# Patient Record
Sex: Female | Born: 1970 | Race: White | Hispanic: Yes | Smoking: Never smoker
Health system: Southern US, Community
[De-identification: ages and names within clinical notes are randomized; demographics above are authoritative.]

## PROBLEM LIST (undated history)

## (undated) DIAGNOSIS — C50919 Malignant neoplasm of unspecified site of unspecified female breast: Secondary | ICD-10-CM

## (undated) HISTORY — PX: CHOLECYSTECTOMY: SHX55

## (undated) HISTORY — DX: Malignant neoplasm of unspecified site of unspecified female breast: C50.919

---

## 2000-12-24 ENCOUNTER — Encounter: Payer: Self-pay | Admitting: Family Medicine

## 2000-12-24 ENCOUNTER — Ambulatory Visit (HOSPITAL_COMMUNITY): Admission: RE | Admit: 2000-12-24 | Discharge: 2000-12-24 | Payer: Self-pay | Admitting: Family Medicine

## 2001-01-15 ENCOUNTER — Observation Stay (HOSPITAL_COMMUNITY): Admission: RE | Admit: 2001-01-15 | Discharge: 2001-01-16 | Payer: Self-pay | Admitting: Surgery

## 2001-01-15 ENCOUNTER — Encounter (INDEPENDENT_AMBULATORY_CARE_PROVIDER_SITE_OTHER): Payer: Self-pay

## 2001-10-10 ENCOUNTER — Ambulatory Visit (HOSPITAL_COMMUNITY): Admission: RE | Admit: 2001-10-10 | Discharge: 2001-10-10 | Payer: Self-pay | Admitting: Family Medicine

## 2001-10-10 ENCOUNTER — Encounter: Payer: Self-pay | Admitting: Family Medicine

## 2001-11-04 ENCOUNTER — Encounter: Payer: Self-pay | Admitting: Surgery

## 2001-11-04 ENCOUNTER — Ambulatory Visit (HOSPITAL_COMMUNITY): Admission: RE | Admit: 2001-11-04 | Discharge: 2001-11-04 | Payer: Self-pay | Admitting: Surgery

## 2005-08-14 ENCOUNTER — Ambulatory Visit: Payer: Self-pay | Admitting: Nurse Practitioner

## 2005-08-23 ENCOUNTER — Ambulatory Visit: Payer: Self-pay | Admitting: Nurse Practitioner

## 2005-08-24 ENCOUNTER — Ambulatory Visit: Payer: Self-pay | Admitting: *Deleted

## 2005-09-18 ENCOUNTER — Encounter: Admission: RE | Admit: 2005-09-18 | Discharge: 2005-09-18 | Payer: Self-pay | Admitting: Internal Medicine

## 2005-11-10 ENCOUNTER — Ambulatory Visit: Payer: Self-pay | Admitting: Nurse Practitioner

## 2005-12-27 ENCOUNTER — Ambulatory Visit: Payer: Self-pay | Admitting: Nurse Practitioner

## 2006-03-22 ENCOUNTER — Ambulatory Visit: Payer: Self-pay | Admitting: Nurse Practitioner

## 2006-10-31 ENCOUNTER — Inpatient Hospital Stay (HOSPITAL_COMMUNITY): Admission: RE | Admit: 2006-10-31 | Discharge: 2006-11-02 | Payer: Self-pay | Admitting: Obstetrics

## 2007-08-07 ENCOUNTER — Encounter (INDEPENDENT_AMBULATORY_CARE_PROVIDER_SITE_OTHER): Payer: Self-pay | Admitting: *Deleted

## 2010-08-14 ENCOUNTER — Inpatient Hospital Stay (HOSPITAL_COMMUNITY): Admission: AD | Admit: 2010-08-14 | Discharge: 2010-08-16 | Payer: Self-pay | Admitting: Obstetrics

## 2010-12-11 ENCOUNTER — Encounter: Payer: Self-pay | Admitting: Internal Medicine

## 2011-02-02 LAB — CBC
HCT: 29.8 % — ABNORMAL LOW (ref 36.0–46.0)
HCT: 36.3 % (ref 36.0–46.0)
Hemoglobin: 12.4 g/dL (ref 12.0–15.0)
MCH: 30.2 pg (ref 26.0–34.0)
MCHC: 34.3 g/dL (ref 30.0–36.0)
MCHC: 34.3 g/dL (ref 30.0–36.0)
MCV: 88 fL (ref 78.0–100.0)
MCV: 89.6 fL (ref 78.0–100.0)
Platelets: 318 10*3/uL (ref 150–400)
RBC: 4.13 MIL/uL (ref 3.87–5.11)
RDW: 15.3 % (ref 11.5–15.5)
RDW: 15.7 % — ABNORMAL HIGH (ref 11.5–15.5)
WBC: 5 10*3/uL (ref 4.0–10.5)

## 2011-02-02 LAB — RPR: RPR Ser Ql: NONREACTIVE

## 2011-04-07 NOTE — Op Note (Signed)
[facility]  Patient:    KALENA, MANDER                         MRN: [id number] Proc. Date: 01/15/01 Adm. Date:  [id number] Attending:  Abigail Miyamoto A                           Operative Report  PREOPERATIVE DIAGNOSIS:  Symptomatic cholelithiasis.  POSTOPERATIVE DIAGNOSIS:  Symptomatic cholelithiasis.  OPERATIVE PROCEDURE:  Laparoscopic cholecystectomy.  SURGEON:  Abigail Miyamoto, M.D.  ASSISTANT:  Gita Kudo, M.D.  ANESTHESIA:  General endotracheal; 0.25% Marcaine plain.  ESTIMATED BLOOD LOSS:  Minimal.  DESCRIPTION OF PROCEDURE:  The patient was brought to the operating room, identified as [employer]. She was placed supine position on the operating room table and general endotracheal anesthesia was induced. Her  abdomen was the prepped and draped in the usual sterile fashion.  Using a #15 blade a small transverse incision was made below the umbilicus. The incision was carried down to the fascia which was then opened with the scalpel. A hemostat was then used to pass into the peritoneal cavity. Next, a #0 Vicryl pursestring suture was placed around the fascial opening. The Hasson port was then placed through the opening and insufflation of the abdomen was begun. Next, an 11 mm port was placed in the patients epigastrium and two 5 mm ports were placed in the patients right flank, all under direct vision. The gallbladder was then identified and grasped and retracted above the liver bed. Dissection was then carried out at the base of the gallbladder. The cystic duct was dissected out and clipped three times proximally and once distally and then transected with scissors. The cystic artery and a branch were then dissected out and clipped several times proximally and once distally and transected as well. The gallbladder was then dissected free from the liver bed with electrocautery. Hemostasis appeared to be achieved in the liver bed with  the cautery. Once the gallbladder was detached from the liver, it was pulled out through the at the umbilicus. The #0 Vicryl at the umbilicus was then tied in place. The abdomen was then copiously irrigated with normal saline; again, hemostasis appeared to be achieved. Next, all ports were then removed under direct vision and the abdomen was deflated. All incisions were then anesthetized with 0.25% Marcaine and closed with 4-0 subcuticular Monocryl sutures. Steri-Strips, gauze and Tegaderm were applied.  The patient tolerated the procedure well. All sponge, needle, lap and instrument counts were correct at the end of the procedure. The patient was then extubated in the operating room and taken in stable condition to the recovery room. DD:  01/15/01 TD:  01/16/01 Job: [id number]

## 2020-07-27 ENCOUNTER — Other Ambulatory Visit: Payer: Self-pay | Admitting: Nurse Practitioner

## 2020-07-27 ENCOUNTER — Other Ambulatory Visit: Payer: Self-pay

## 2020-07-27 ENCOUNTER — Ambulatory Visit
Admission: RE | Admit: 2020-07-27 | Discharge: 2020-07-27 | Disposition: A | Discharge status: Home / Self Care | Payer: No Typology Code available for payment source | Source: Ambulatory Visit | Attending: Nurse Practitioner | Admitting: Nurse Practitioner

## 2020-07-27 DIAGNOSIS — M25511 Pain in right shoulder: Secondary | ICD-10-CM

## 2020-07-27 DIAGNOSIS — M542 Cervicalgia: Secondary | ICD-10-CM

## 2021-11-17 IMAGING — CR DG CERVICAL SPINE COMPLETE 4+V
5 series · 5 of 5 positions shown · non-contrast
Comparison: None.

CLINICAL DATA: Neck/shoulder pain

EXAM:
CERVICAL SPINE - COMPLETE 4+ VIEW

[w cervical spine lat]
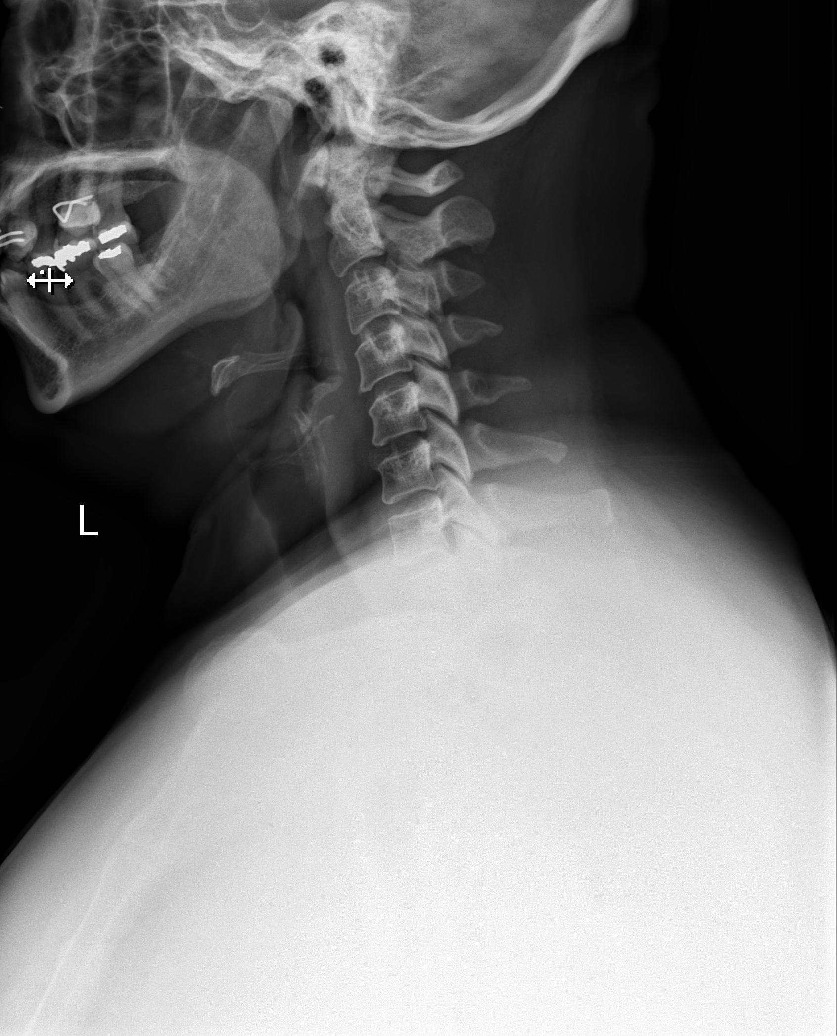

[w cervical spine ap_obl (1 of 2)]
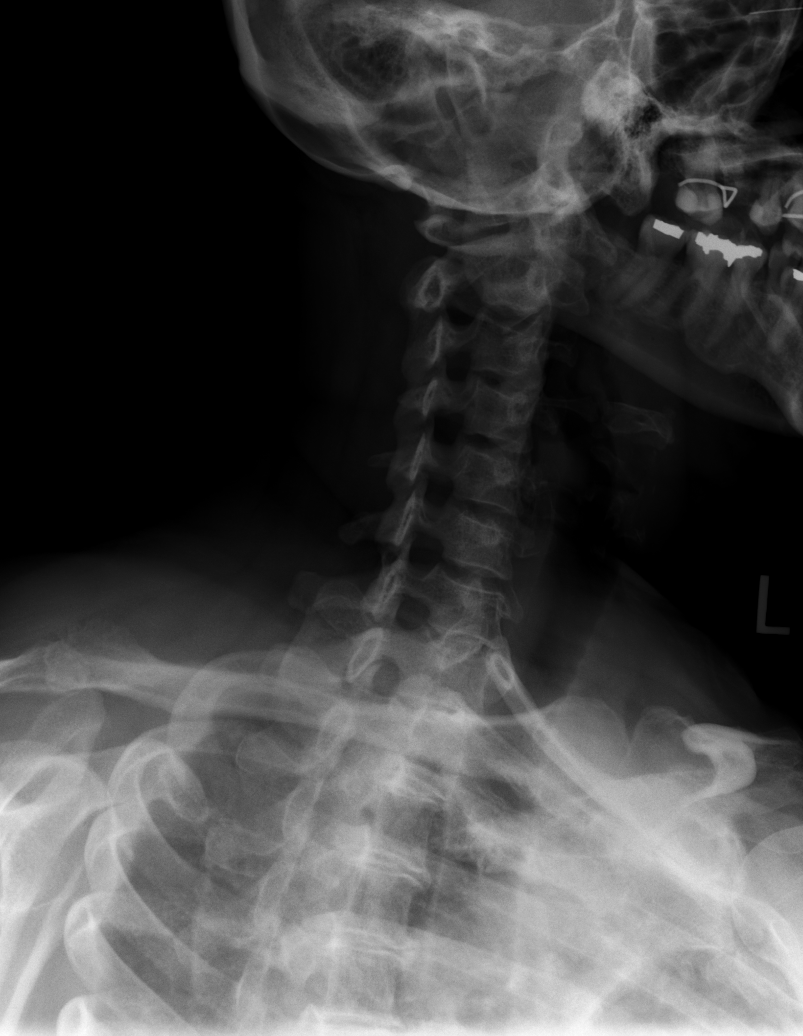

[w cervical spine ap_obl (2 of 2)]
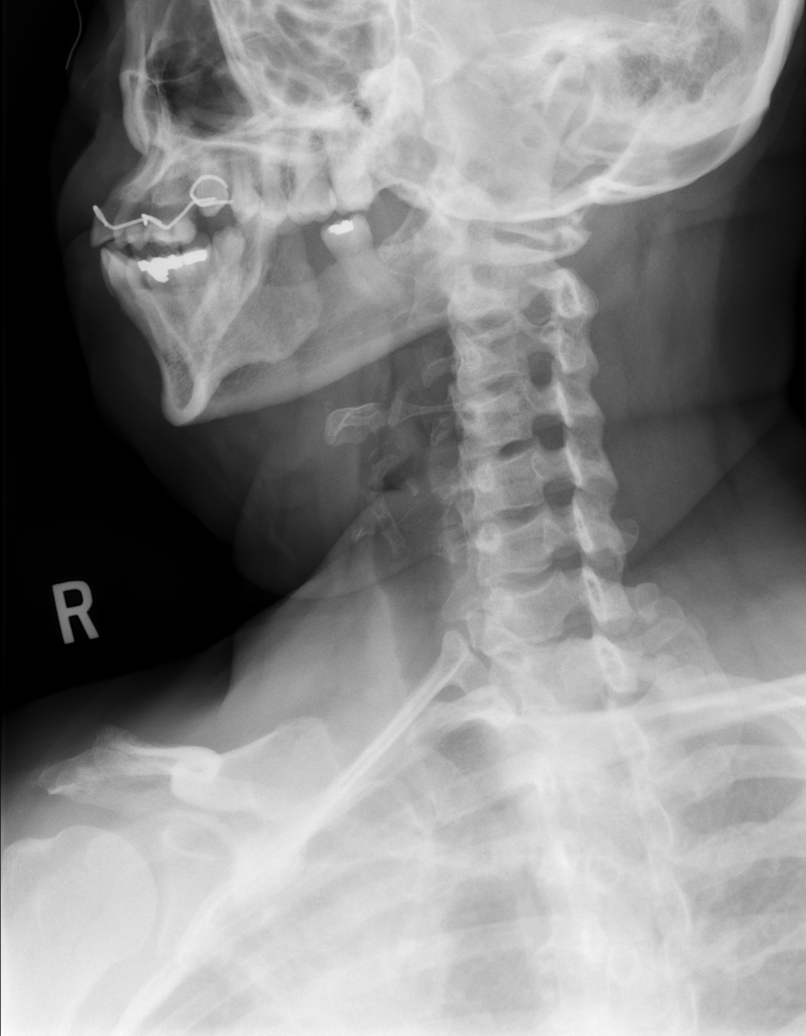

[w cervical spine ap]
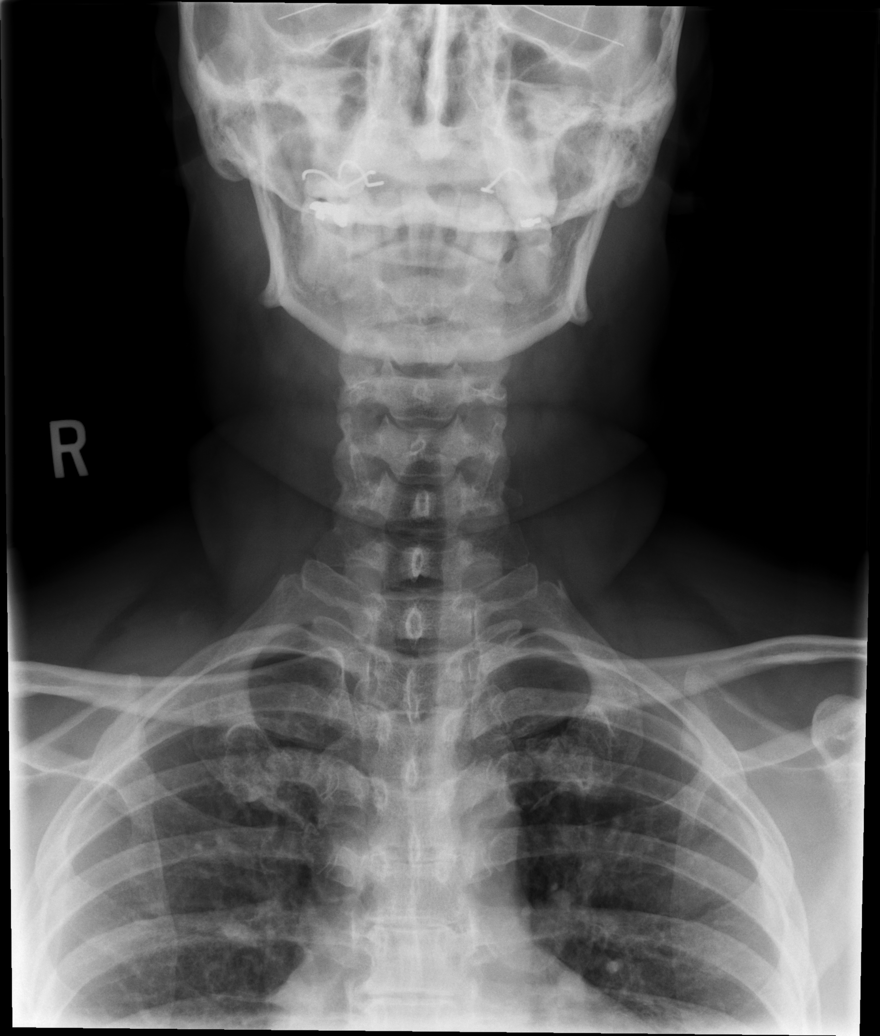

[w cervical spine odontoid]
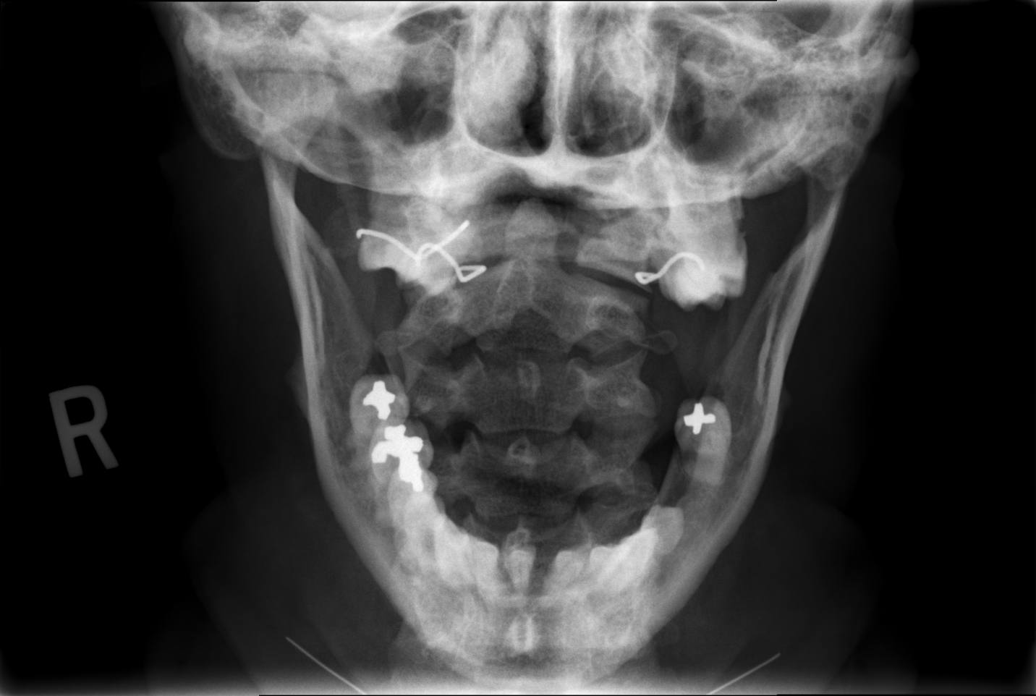

[5 of 5 positions shown; findings below may reference images not displayed]

FINDINGS: Straightening of the cervical spine, likely positional.

No evidence of fracture or dislocation. Vertebral body heights and
intervertebral disc spaces are maintained. Dens appears intact.
Lateral masses of C1 are symmetric.

Bilateral neural foramina are patent.

Visualized lung apices are clear.
IMPRESSION: Negative cervical spine radiographs.

## 2023-01-15 ENCOUNTER — Ambulatory Visit
Admission: EM | Admit: 2023-01-15 | Discharge: 2023-01-15 | Disposition: A | Discharge status: Home / Self Care | Payer: No Typology Code available for payment source

## 2023-01-15 DIAGNOSIS — R220 Localized swelling, mass and lump, head: Secondary | ICD-10-CM

## 2023-01-15 DIAGNOSIS — L509 Urticaria, unspecified: Secondary | ICD-10-CM

## 2023-01-15 MED ORDER — PREDNISONE 20 MG PO TABS: ORAL_TABLET | ORAL | 0 refills | Status: DC

## 2023-01-15 NOTE — ED Provider Notes (Addendum)
[facility]  Note:  This document was prepared using Systems analyst and may include unintentional dictation errors.  MRN: [id number] DOB: 01-01-1971  Subjective:   Latoya Fernandez is a 52 y.o. female presenting for 2 month history of persistent intermittent eyelid swelling, facial swelling, hives, itching.  Patient was seen at a different clinic and advised to start montelukast and Benadryl which she has been doing consistently.  Patient does not have the symptoms every day but feels like she is not getting resolution.  The only possible inciting factor she can think of is a bunny rabbit that they have had for the past 2 years. Denies eating any new foods, starting new medications, exposure to poisonous plants, new hygiene products, new cleaning products or detergents.  No oral swelling, chest tightness, shortness of breath, wheezing.  No current facility-administered medications for this encounter.  Current Outpatient Medications:    montelukast (SINGULAIR) 10 MG tablet, Take 10 mg by mouth at bedtime., Disp: , Rfl:    No Known Allergies  History reviewed. No pertinent past medical history.   Past Surgical History:  Procedure Laterality Date   CHOLECYSTECTOMY      No family history on file.  Social History   Tobacco Use   Smoking status: Never   Smokeless tobacco: Never  Vaping Use   Vaping Use: Never used  Substance Use Topics   Alcohol use: Yes    Comment: occ   Drug use: Never    ROS   Objective:   Vitals: BP 138/82 (BP Location: Right Arm)   Pulse 82   Temp 98.3 F (36.8 C) (Oral)   Resp 18   SpO2 96%   Physical Exam Constitutional:      General: She is not in acute distress.    Appearance: Normal appearance. She is well-developed and normal weight. She is not ill-appearing, toxic-appearing or diaphoretic.  HENT:     Head: Normocephalic and atraumatic.     Right Ear: Tympanic membrane, ear canal and external ear  normal. No drainage or tenderness. No middle ear effusion. There is no impacted cerumen. Tympanic membrane is not erythematous or bulging.     Left Ear: Tympanic membrane, ear canal and external ear normal. No drainage or tenderness.  No middle ear effusion. There is no impacted cerumen. Tympanic membrane is not erythematous or bulging.     Nose: Nose normal. No congestion or rhinorrhea.     Mouth/Throat:     Mouth: Mucous membranes are moist. No oral lesions.     Pharynx: No pharyngeal swelling, oropharyngeal exudate, posterior oropharyngeal erythema or uvula swelling.     Tonsils: No tonsillar exudate or tonsillar abscesses.  Eyes:     General: Lids are normal. Lids are everted, no foreign bodies appreciated. Vision grossly intact. No scleral icterus.       Right eye: No foreign body, discharge or hordeolum.        Left eye: No foreign body, discharge or hordeolum.     Extraocular Movements: Extraocular movements intact.     Right eye: Normal extraocular motion.     Left eye: Normal extraocular motion and no nystagmus.     Conjunctiva/sclera: Conjunctivae normal.     Right eye: Right conjunctiva is not injected. No chemosis, exudate or hemorrhage.    Left eye: Left conjunctiva is not injected. No chemosis, exudate or hemorrhage.    Comments: 1+ swelling of bilateral upper and lower eyelids.  No warmth, erythema, tenderness.  Cardiovascular:     Rate and Rhythm: Normal rate and regular rhythm.     Heart sounds: Normal heart sounds. No murmur heard.    No friction rub. No gallop.  Pulmonary:     Effort: Pulmonary effort is normal. No respiratory distress.     Breath sounds: No stridor. No wheezing, rhonchi or rales.  Chest:     Chest wall: No tenderness.  Musculoskeletal:     Cervical back: Normal range of motion and neck supple.  Lymphadenopathy:     Cervical: No cervical adenopathy.  Skin:    General: Skin is warm and dry.     Findings: No rash.  Neurological:     General: No  focal deficit present.     Mental Status: She is alert and oriented to person, place, and time.  Psychiatric:        Mood and Affect: Mood normal.        Behavior: Behavior normal.     Assessment and Plan :   PDMP not reviewed this encounter.  1. Facial swelling   2. Urticaria     Patient does not have signs of anaphylaxis at this time.  However, I am concerned that with repeated exposure she will have worsening reactions and therefore reviewed signs and symptoms of this.  Recommended oral prednisone course.  Patient has to follow-up with the allergy clinic.  I placed a referral..  Maintain Benadryl and montelukast.  Monitor for new exposures and remove anything new as much as possible. Counseled patient on potential for adverse effects with medications prescribed/recommended today, ER and return-to-clinic precautions discussed, patient verbalized understanding.    Jaynee Eagles, [place] 01/15/23 [id number]

## 2023-01-15 NOTE — ED Triage Notes (Addendum)
Per daughter/interpreter-pt c/o itching and bumps to head,scattered rash started last night-SHOB yesterday that was resolved with inhaler use-swelling to face this am-NAD-steady gait

## 2023-02-08 ENCOUNTER — Ambulatory Visit: Payer: Self-pay | Admitting: Allergy

## 2023-12-13 ENCOUNTER — Other Ambulatory Visit: Payer: Self-pay | Admitting: Obstetrics & Gynecology

## 2023-12-13 DIAGNOSIS — Z1231 Encounter for screening mammogram for malignant neoplasm of breast: Secondary | ICD-10-CM

## 2024-01-10 ENCOUNTER — Inpatient Hospital Stay: Admission: RE | Admit: 2024-01-10 | Payer: No Typology Code available for payment source | Source: Ambulatory Visit

## 2024-01-17 ENCOUNTER — Ambulatory Visit
Admission: RE | Admit: 2024-01-17 | Discharge: 2024-01-17 | Disposition: A | Discharge status: Home / Self Care | Payer: No Typology Code available for payment source | Source: Ambulatory Visit | Attending: Obstetrics & Gynecology | Admitting: Obstetrics & Gynecology

## 2024-01-17 DIAGNOSIS — Z1231 Encounter for screening mammogram for malignant neoplasm of breast: Secondary | ICD-10-CM

## 2024-01-23 ENCOUNTER — Other Ambulatory Visit: Payer: Self-pay | Admitting: Internal Medicine

## 2024-01-23 ENCOUNTER — Other Ambulatory Visit: Payer: Self-pay | Admitting: Obstetrics & Gynecology

## 2024-01-23 ENCOUNTER — Other Ambulatory Visit: Payer: Self-pay

## 2024-01-23 DIAGNOSIS — R928 Other abnormal and inconclusive findings on diagnostic imaging of breast: Secondary | ICD-10-CM

## 2024-02-04 ENCOUNTER — Encounter: Payer: Self-pay | Admitting: Internal Medicine

## 2024-02-07 ENCOUNTER — Ambulatory Visit
Admission: RE | Admit: 2024-02-07 | Discharge: 2024-02-07 | Disposition: A | Discharge status: Home / Self Care | Source: Ambulatory Visit | Attending: Internal Medicine | Admitting: Internal Medicine

## 2024-02-07 ENCOUNTER — Ambulatory Visit: Admitting: Hematology and Oncology

## 2024-02-07 ENCOUNTER — Ambulatory Visit
Admission: RE | Admit: 2024-02-07 | Discharge: 2024-02-07 | Disposition: A | Discharge status: Home / Self Care | Source: Ambulatory Visit | Attending: Obstetrics & Gynecology | Admitting: Obstetrics & Gynecology

## 2024-02-07 ENCOUNTER — Other Ambulatory Visit: Payer: Self-pay | Admitting: Obstetrics and Gynecology

## 2024-02-07 VITALS — BP 126/68 | Ht <= 58 in | Wt 193.0 lb

## 2024-02-07 DIAGNOSIS — R928 Other abnormal and inconclusive findings on diagnostic imaging of breast: Secondary | ICD-10-CM

## 2024-02-07 DIAGNOSIS — R921 Mammographic calcification found on diagnostic imaging of breast: Secondary | ICD-10-CM

## 2024-02-07 DIAGNOSIS — Z1211 Encounter for screening for malignant neoplasm of colon: Secondary | ICD-10-CM

## 2024-02-07 DIAGNOSIS — N6489 Other specified disorders of breast: Secondary | ICD-10-CM

## 2024-02-07 NOTE — Patient Instructions (Signed)
 Taught Latoya Fernandez about self breast awareness and gave educational materials to take home. Patient did not need a Pap smear today due to last Pap smear was in 2025 per patient. Told patient about free cervical cancer screenings to receive a Pap smear if would like one next year. Let her know [facility] will cover Pap smears every 5 years unless has a history of abnormal Pap smears. Referred patient to the [facility] for diagnostic mammogram. Appointment scheduled for 02/07/2024. Patient aware of appointment and will be there. Let patient know will follow up with her within the next couple weeks with results. Latoya Fernandez verbalized understanding.  Pascal Lux, NP 9:36 AM

## 2024-02-07 NOTE — Progress Notes (Signed)
 Ms. Luccia Reinheimer is a 53 y.o. female who presents to [facility] clinic today with no complaints. Call back from mammogram in February resulting in possible distortion with calcifications in the left breast.    Pap Smear: Pap not smear completed today. Last Pap smear was 2025 at [facility] clinic and was normal. Per patient has no history of an abnormal Pap smear. Last Pap smear result is available in Epic.   Physical exam: Breasts Breasts symmetrical. No skin abnormalities bilateral breasts. No nipple retraction bilateral breasts. No nipple discharge bilateral breasts. No lymphadenopathy. No lumps palpated bilateral breasts.       Pelvic/Bimanual Pap is not indicated today    Smoking History: Patient has never smoked and was not referred to quit line.    Patient Navigation: Patient education provided. Access to services provided for patient through [facility] program. Natale Lay interpreter provided. No transportation provided   Colorectal Cancer Screening: Per patient has never had colonoscopy completed No complaints today. FIT test given.   Breast and Cervical Cancer Risk Assessment: Patient does not have family history of breast cancer, known genetic mutations, or radiation treatment to the chest before age 46. Patient does not have history of cervical dysplasia, immunocompromised, or DES exposure in-utero.  Risk Scores as of Encounter on 02/07/2024     Dondra Spry           5-year 0.72%   Lifetime 5.67%   This patient is Hispana/Latina but has no documented birth country, so the [employer] model used data from [place] patients to calculate their risk score. Document a birth country in the Demographics activity for a more accurate score.         Last calculated by Caprice Red, CMA on 02/07/2024 at  9:10 AM        A: [facility] exam without pap smear No complaints with benign exam. Follow up possible distortion with calcifications in the left breast.  P: Referred patient to the [facility] for a diagnostic mammogram. Appointment scheduled 02/07/2024.  Pascal Lux, NP 02/07/2024 9:33 AM

## 2024-02-13 LAB — FECAL OCCULT BLOOD, IMMUNOCHEMICAL: Fecal Occult Bld: NEGATIVE

## 2024-02-14 ENCOUNTER — Ambulatory Visit
Admission: RE | Admit: 2024-02-14 | Discharge: 2024-02-14 | Disposition: A | Discharge status: Home / Self Care | Source: Ambulatory Visit | Attending: Obstetrics and Gynecology | Admitting: Obstetrics and Gynecology

## 2024-02-14 DIAGNOSIS — N6489 Other specified disorders of breast: Secondary | ICD-10-CM

## 2024-02-14 DIAGNOSIS — R921 Mammographic calcification found on diagnostic imaging of breast: Secondary | ICD-10-CM

## 2024-02-14 HISTORY — PX: BREAST BIOPSY: SHX20

## 2024-02-15 ENCOUNTER — Telehealth: Payer: Self-pay | Admitting: *Deleted

## 2024-02-15 NOTE — Telephone Encounter (Signed)
 Spoke to patient to confirm upcoming afternoon [facility] clinic appointment on 4/2, paperwork will be sent via mail  Gave location and time, also informed patient that the surgeon's office would be calling as well to get information from them similar to the packet that they will be receiving so make sure to do both.  Reminded patient that all providers will be coming to the clinic to see them HERE and if they had any questions to not hesitate to reach back out to myself or their navigators.

## 2024-02-18 ENCOUNTER — Encounter: Payer: Self-pay | Admitting: *Deleted

## 2024-02-18 LAB — SURGICAL PATHOLOGY

## 2024-02-19 ENCOUNTER — Other Ambulatory Visit: Payer: Self-pay | Admitting: *Deleted

## 2024-02-19 DIAGNOSIS — C50212 Malignant neoplasm of upper-inner quadrant of left female breast: Secondary | ICD-10-CM | POA: Insufficient documentation

## 2024-02-20 ENCOUNTER — Other Ambulatory Visit: Payer: Self-pay | Admitting: *Deleted

## 2024-02-20 ENCOUNTER — Encounter: Payer: Self-pay | Admitting: General Practice

## 2024-02-20 ENCOUNTER — Ambulatory Visit
Admission: RE | Admit: 2024-02-20 | Discharge: 2024-02-20 | Disposition: A | Discharge status: Home / Self Care | Payer: Self-pay | Source: Ambulatory Visit | Attending: Radiation Oncology | Admitting: Radiation Oncology

## 2024-02-20 ENCOUNTER — Encounter: Payer: Self-pay | Admitting: Hematology and Oncology

## 2024-02-20 ENCOUNTER — Ambulatory Visit: Payer: Self-pay | Attending: Surgery | Admitting: Physical Therapy

## 2024-02-20 ENCOUNTER — Ambulatory Visit: Payer: Self-pay | Admitting: Surgery

## 2024-02-20 ENCOUNTER — Inpatient Hospital Stay (HOSPITAL_BASED_OUTPATIENT_CLINIC_OR_DEPARTMENT_OTHER): Payer: Self-pay | Admitting: Genetic Counselor

## 2024-02-20 ENCOUNTER — Inpatient Hospital Stay: Payer: Self-pay | Attending: Hematology and Oncology | Admitting: Hematology and Oncology

## 2024-02-20 ENCOUNTER — Inpatient Hospital Stay: Payer: Self-pay

## 2024-02-20 ENCOUNTER — Encounter: Payer: Self-pay | Admitting: Physical Therapy

## 2024-02-20 VITALS — BP 140/68 | HR 73 | Temp 98.2°F | Resp 18 | Ht <= 58 in | Wt 189.3 lb

## 2024-02-20 DIAGNOSIS — R293 Abnormal posture: Secondary | ICD-10-CM | POA: Insufficient documentation

## 2024-02-20 DIAGNOSIS — C50212 Malignant neoplasm of upper-inner quadrant of left female breast: Secondary | ICD-10-CM

## 2024-02-20 DIAGNOSIS — Z170 Estrogen receptor positive status [ER+]: Secondary | ICD-10-CM

## 2024-02-20 DIAGNOSIS — C50912 Malignant neoplasm of unspecified site of left female breast: Secondary | ICD-10-CM

## 2024-02-20 LAB — CBC WITH DIFFERENTIAL (CANCER CENTER ONLY)
Abs Immature Granulocytes: 0.01 10*3/uL (ref 0.00–0.07)
Basophils Absolute: 0.1 10*3/uL (ref 0.0–0.1)
Basophils Relative: 1 %
Eosinophils Absolute: 0.1 10*3/uL (ref 0.0–0.5)
Eosinophils Relative: 3 %
HCT: 44.8 % (ref 36.0–46.0)
Hemoglobin: 14.9 g/dL (ref 12.0–15.0)
Immature Granulocytes: 0 %
Lymphocytes Relative: 38 %
Lymphs Abs: 1.6 10*3/uL (ref 0.7–4.0)
MCH: 29.6 pg (ref 26.0–34.0)
MCHC: 33.3 g/dL (ref 30.0–36.0)
MCV: 89.1 fL (ref 80.0–100.0)
Monocytes Absolute: 0.4 10*3/uL (ref 0.1–1.0)
Monocytes Relative: 10 %
Neutro Abs: 2 10*3/uL (ref 1.7–7.7)
Neutrophils Relative %: 48 %
Platelet Count: 322 10*3/uL (ref 150–400)
RBC: 5.03 MIL/uL (ref 3.87–5.11)
RDW: 13.6 % (ref 11.5–15.5)
WBC Count: 4.3 10*3/uL (ref 4.0–10.5)
nRBC: 0 % (ref 0.0–0.2)

## 2024-02-20 LAB — CMP (CANCER CENTER ONLY)
ALT: 31 U/L (ref 0–44)
AST: 23 U/L (ref 15–41)
Albumin: 4.3 g/dL (ref 3.5–5.0)
Alkaline Phosphatase: 79 U/L (ref 38–126)
Anion gap: 5 (ref 5–15)
BUN: 14 mg/dL (ref 6–20)
CO2: 30 mmol/L (ref 22–32)
Calcium: 9.1 mg/dL (ref 8.9–10.3)
Chloride: 106 mmol/L (ref 98–111)
Creatinine: 0.74 mg/dL (ref 0.44–1.00)
GFR, Estimated: >60 mL/min (ref >60)
Glucose, Bld: 68 mg/dL — ABNORMAL LOW (ref 70–99)
Potassium: 4 mmol/L (ref 3.5–5.1)
Sodium: 141 mmol/L (ref 135–145)
Total Bilirubin: 0.5 mg/dL (ref 0.0–1.2)
Total Protein: 7.5 g/dL (ref 6.5–8.1)

## 2024-02-20 LAB — GENETIC SCREENING ORDER

## 2024-02-20 NOTE — Progress Notes (Signed)
 Latoya Fernandez CONSULT NOTE  Patient Care Team: Patient, No Pcp Per as PCP - General (General Practice) Latoya Angelica, RN as Oncology Nurse Navigator Latoya Proud, RN as Oncology Nurse Navigator Latoya Bouillon, MD as Consulting Physician (General Surgery) Latoya Croissant, MD as Consulting Physician (Hematology and Oncology) Latoya Puffer, MD as Consulting Physician (Radiation Oncology)  CHIEF COMPLAINTS/PURPOSE OF CONSULTATION:  Newly diagnosed breast cancer  HISTORY OF PRESENTING ILLNESS:  Latoya Fernandez is a Hispanic lady who had a routine screening mammogram that detected left breast distortion and calcifications measuring 0.6 cm.  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  She was presented this morning at the multidisciplinary tumor board and she is here today accompanied by her husband and to discuss her treatment plan.  I reviewed her records extensively and collaborated the history with the patient.  SUMMARY OF ONCOLOGIC HISTORY: Oncology History  Malignant neoplasm of upper-inner quadrant of left breast in female, estrogen receptor positive (HCC)  02/14/2024 Initial Diagnosis   Screening mammogram detected left breast distortion and calcifications measuring 0.6 cm, stereotactic biopsy: Grade 2 IDC with DCIS intermediate grade ER 95%, PR 100%, Ki67 1%, HER2 negative by FISH, second biopsy: ADH   02/20/2024 Cancer Staging   Staging form: Breast, AJCC 8th Edition - Clinical: Stage IA (cT1b, cN0, cM0, G2, ER+, PR+, HER2-) - Signed by Latoya Croissant, MD on 02/20/2024 Stage prefix: Initial diagnosis Histologic grading system: 3 grade system      MEDICAL HISTORY:  Past Medical History:  Diagnosis Date   Breast cancer [facility])     SURGICAL HISTORY: Past Surgical History:  Procedure Laterality Date   BREAST BIOPSY Left 02/14/2024   MM LT BREAST BX W  LOC DEV 1ST LESION IMAGE BX SPEC STEREO GUIDE 02/14/2024 [facility]MO[facility]   BREAST BIOPSY Left 02/14/2024   MM LT BREAST BX W LOC DEV EA AD LESION IMG BX SPEC STEREO GUIDE 02/14/2024 [facility]   CHOLECYSTECTOMY      SOCIAL HISTORY: Social History   Socioeconomic History   Marital status: Legally Separated    Spouse name: Not on file   Number of children: 5   Years of education: Not on file   Highest education level: Not on file  Occupational History   Not on file  Tobacco Use   Smoking status: Never   Smokeless tobacco: Never  Vaping Use   Vaping status: Never Used  Substance and Sexual Activity   Alcohol use: Yes    Comment: occ   Drug use: Never   Sexual activity: Not on file  Other Topics Concern   Not on file  Social History Narrative   Pt was born in [place]   Social Drivers of Health   Financial Resource Strain: Not on file  Food Insecurity: No Food Insecurity (02/07/2024)   Hunger Vital Sign    Worried About Running Out of Food in the Last Year: Never true    Ran Out of Food in the Last Year: Never true  Transportation Needs: No Transportation Needs (02/07/2024)   PRAPARE - Administrator, Civil Service (Medical): No    Lack of Transportation (Non-Medical): No  Physical Activity: Not on file  Stress: Not on file  Social Connections: Not on file  Intimate Partner Violence: Not on file    FAMILY HISTORY: Family History  Problem Relation Age of  Onset   Breast cancer Neg Hx     ALLERGIES:  has no known allergies.  MEDICATIONS:  Current Outpatient Medications  Medication Sig Dispense Refill   montelukast (SINGULAIR) 10 MG tablet Take 10 mg by mouth at bedtime.     predniSONE (DELTASONE) 20 MG tablet Take 2 tablets daily with breakfast. 10 tablet 0   UNABLE TO FIND Allergy injection     No current facility-administered medications for this visit.    REVIEW OF SYSTEMS:   Constitutional: Denies fevers, chills or abnormal night  sweats Breast:  Denies any palpable lumps or discharge All other systems were reviewed with the patient and are negative.  PHYSICAL EXAMINATION: ECOG PERFORMANCE STATUS: 1 - Symptomatic but completely ambulatory  Vitals:   02/20/24 1340  BP: (!) 140/68  Pulse: 73  Resp: 18  Temp: 98.2 F (36.8 C)  SpO2: 100%   Filed Weights   02/20/24 1340  Weight: 189 lb 4.8 oz (85.9 kg)    GENERAL:alert, no distress and comfortable    LABORATORY DATA:  I have reviewed the data as listed Lab Results  Component Value Date   WBC 4.3 02/20/2024   HGB 14.9 02/20/2024   HCT 44.8 02/20/2024   MCV 89.1 02/20/2024   PLT 322 02/20/2024   Lab Results  Component Value Date   NA 141 02/20/2024   K 4.0 02/20/2024   CL 106 02/20/2024   CO2 30 02/20/2024    RADIOGRAPHIC STUDIES: I have personally reviewed the radiological reports and agreed with the findings in the report.  ASSESSMENT AND PLAN:  Malignant neoplasm of upper-inner quadrant of left breast in female, estrogen receptor positive (HCC) 02/14/2024:Screening mammogram detected left breast distortion and calcifications measuring 0.6 cm, stereotactic biopsy: Grade 2 IDC with DCIS intermediate grade ER 95%, PR 100%, Ki67 1%, HER2 negative by FISH, second biopsy: ADH  Pathology and radiology counseling:Discussed with the patient, the details of pathology including the type of breast cancer,the clinical staging, the significance of ER, PR and HER-2/neu receptors and the implications for treatment. After reviewing the pathology in detail, we proceeded to discuss the different treatment options between surgery, radiation, chemotherapy, antiestrogen therapies.  Recommendations: 1. Breast conserving surgery followed by 2. Oncotype DX testing to determine if chemotherapy would be of any benefit followed by 3. Adjuvant radiation therapy followed by 4. Adjuvant antiestrogen therapy  Oncotype counseling: I discussed Oncotype DX test. I explained  to the patient that this is a 21 gene panel to evaluate patient tumors DNA to calculate recurrence score. This would help determine whether patient has high risk or low risk breast cancer. She understands that if her tumor was found to be high risk, she would benefit from systemic chemotherapy. If low risk, no need of chemotherapy.  Return to clinic after surgery to discuss final pathology report and then determine if Oncotype DX testing will need to be sent.    All questions were answered. The patient knows to call the clinic with any problems, questions or concerns.    Tamsen Meek, MD 02/20/24

## 2024-02-20 NOTE — Progress Notes (Signed)
 Radiation Oncology         [phone] ________________________________  Name: Latoya Fernandez        MRN: [id number]  Date of Service: 02/20/2024 DOB: [birth year]  [id number], No Pcp Per  Harriette Bouillon, MD     REFERRING PHYSICIAN: Harriette Bouillon, MD   DIAGNOSIS: The encounter diagnosis was Malignant neoplasm of upper-inner quadrant of left breast in female, estrogen receptor positive (HCC).   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 female seen in the multidisciplinary breast clinic for a new diagnosis of left breast cancer. The patient was noted to have an abnormality on screening mammogram. She proceeded with diagnostic mammogram on 02/07/2024 that showed a distortion in the upper inner quadrant of the left breast. No left axillary lymphadenopathy was seen. Accordingly, patient underwent a biopsy on 02/07/2024 that revealed grade intermediate grade invasive ductal carcinoma  that was ER and PR positive and HER2 negative with a Ki-67 1%.   She is seen today to discuss treatment recommendations of her cancer.      PREVIOUS RADIATION THERAPY: No   PAST MEDICAL HISTORY:  Past Medical History:  Diagnosis Date   Breast cancer (HCC)        PAST SURGICAL HISTORY: Past Surgical History:  Procedure Laterality Date   BREAST BIOPSY Left 02/14/2024   MM LT BREAST BX W LOC DEV 1ST LESION IMAGE BX SPEC STEREO GUIDE 02/14/2024 [facility]   BREAST BIOPSY Left 02/14/2024   MM LT BREAST BX W LOC DEV EA AD LESION IMG BX SPEC STEREO GUIDE 02/14/2024 [facility]   CHOLECYSTECTOMY       FAMILY HISTORY:  Family History  Problem Relation Age of Onset   Breast cancer Neg Hx      SOCIAL HISTORY:  reports that she has never smoked. She has never used smokeless tobacco. She reports current alcohol use. She reports that she does not use drugs.   ALLERGIES: Patient  has no known allergies.   MEDICATIONS:  Current Outpatient Medications  Medication Sig Dispense Refill   montelukast (SINGULAIR) 10 MG tablet Take 10 mg by mouth at bedtime.     predniSONE (DELTASONE) 20 MG tablet Take 2 tablets daily with breakfast. 10 tablet 0   UNABLE TO FIND Allergy injection     No current facility-administered medications for this encounter.     REVIEW OF SYSTEMS: On review of systems, the patient reports that she is doing well overall. No breast specific complaints are verbalized.        PHYSICAL EXAM:  Wt Readings from Last 3 Encounters:  02/20/24 189 lb 4.8 oz (85.9 kg)  02/07/24 193 lb (87.5 kg)   Temp Readings from Last 3 Encounters:  02/20/24 98.2 F (36.8 C) (Temporal)  01/15/23 98.3 F (36.8 C) (Oral)   BP Readings from Last 3 Encounters:  02/20/24 (!) 140/68  02/07/24 126/68  01/15/23 138/82   Pulse Readings from Last 3 Encounters:  02/20/24 73  01/15/23 82    In general this is a well appearing female in no acute distress. She's alert and oriented x4 and appropriate throughout the examination. Cardiopulmonary assessment is negative for acute distress and she exhibits normal effort. Bilateral breast exam is deferred.    ECOG = 0  0 - Asymptomatic (Fully active, able to carry on all predisease activities without restriction)  1 - Symptomatic but completely ambulatory (  Restricted in physically strenuous activity but ambulatory and able to carry out work of a light or sedentary nature. For example, light housework, office work)  2 - Symptomatic, <50% in bed during the day (Ambulatory and capable of all self care but unable to carry out any work activities. Up and about more than 50% of waking hours)  3 - Symptomatic, >50% in bed, but not bedbound (Capable of only limited self-care, confined to bed or chair 50% or more of waking hours)  4 - Bedbound (Completely disabled. Cannot carry on any self-care. Totally confined to bed or  chair)  5 - Death   Santiago Glad MM, Creech RH, Tormey DC, et al. [phone]). "Toxicity and response criteria of the [facility]". Am. Evlyn Clines. Oncol. 5 (6): 649-55    LABORATORY DATA:  Lab Results  Component Value Date   WBC 4.3 02/20/2024   HGB 14.9 02/20/2024   HCT 44.8 02/20/2024   MCV 89.1 02/20/2024   PLT 322 02/20/2024   Lab Results  Component Value Date   NA 141 02/20/2024   K 4.0 02/20/2024   CL 106 02/20/2024   CO2 30 02/20/2024   Lab Results  Component Value Date   ALT 31 02/20/2024   AST 23 02/20/2024   ALKPHOS 79 02/20/2024   BILITOT 0.5 02/20/2024      RADIOGRAPHY: MM LT BREAST BX W LOC DEV 1ST LESION IMAGE BX SPEC STEREO GUIDE Addendum Date: 02/18/2024 ADDENDUM REPORT: 02/18/2024 09:00 ADDENDUM: Pathology revealed GRADE II INVASIVE MODERATELY DIFFERENTIATED DUCTAL ADENOCARCINOMA, DUCTAL CARCINOMA IN SITU, INTERMEDIATE NUCLEAR GRADE, CRIBRIFORM TYPE, MICROCALCIFICATIONS PRESENT WITHIN DCIS AND USUAL DUCT HYPERPLASIA of the LEFT breast, upper inner, posterior depth, (x clip). This was found to be concordant by Dr. Amie Portland. Pathology revealed FOCAL ATYPICAL DUCTAL HYPERPLASIA, BACKGROUND BREAST OF PREDOMINANTLY MATURE ADIPOSE SHOWING FIBROCYSTIC CHANGES INCLUDING CYSTIC DILATATION OF DUCTS, ADENOSIS AND USUAL DUCT HYPERPLASIA, MICROCALCIFICATIONS PRESENT WITHIN ADENOSIS AND USUAL DUCT HYPERPLASIA of the LEFT breast, upper inner, middle depth, (ribbon clip). This was found to be concordant by Dr. Amie Portland, for consideration of excision. Pathology results were discussed with the patient by telephone on February 15, 2024, by [employer], [place], [place] # [id number]. The patient reported doing well after the biopsies with tenderness at the sites. Post biopsy instructions and care were reviewed and questions were answered. The patient was encouraged to call [facility] for any additional concerns. My direct  number was provided. The patient was referred to [facility] on February 20, 2024. Pathology results reported by Rene Kocher, RN on 02/18/2024. Electronically Signed   By: Amie Portland M.D.   On: 02/18/2024 09:00   Result Date: 02/18/2024 CLINICAL DATA:  Patient presents for stereotactic core needle biopsy of a left breast architectural distortion and nearby calcifications. EXAM: LEFT BREAST STEREOTACTIC CORE NEEDLE BIOPSY: 2 LEFT BREAST STEREOTACTIC BIOPSIES PERFORMED. COMPARISON:  Previous exam(s). FINDINGS: The patient and I discussed the procedure of stereotactic-guided biopsy including benefits and alternatives. We discussed the high likelihood of a successful procedure. We discussed the risks of the procedure including infection, bleeding, tissue injury, clip migration, and inadequate sampling. Informed written consent was given. The usual time out protocol was performed immediately prior to the procedure. Biopsy #1: Architectural distortion, upper outer quadrant. Using sterile technique and 1% Lidocaine as local anesthetic, under stereotactic guidance, a 9 gauge vacuum assisted device was used to perform core needle  biopsy of the distortion with associated calcifications in the upper inner left breast using a superior approach. Specimen radiograph was performed showing calcifications. Specimens with calcifications are identified for pathology. Lesion quadrant: Upper inner quadrant At the conclusion of the procedure, an X shaped tissue marker clip was deployed into the biopsy cavity. Biopsy #2: Using sterile technique and 1% Lidocaine as local anesthetic, under stereotactic guidance, a 9 gauge vacuum assisted device was used to perform core needle biopsy of calcifications in the upper outer quadrant of the left breast using a superior approach. Specimen radiograph was performed showing calcifications. Specimens with calcifications are  identified for pathology. Lesion quadrant: Upper inner quadrant At the conclusion of the procedure, a ribbon shaped tissue marker clip was deployed into the biopsy cavity. Follow-up 2-view mammogram was performed and dictated separately. IMPRESSION: Stereotactic-guided biopsy of a left breast architectural distortion and associated calcifications a separate group of calcifications, both in the upper inner quadrant. No apparent complications. Electronically Signed: By: Amie Portland M.D. On: 02/14/2024 11:24   MM LT BREAST BX W LOC DEV EA AD LESION IMG BX SPEC STEREO GUIDE Addendum Date: 02/18/2024 ADDENDUM REPORT: 02/18/2024 09:00 ADDENDUM: Pathology revealed GRADE II INVASIVE MODERATELY DIFFERENTIATED DUCTAL ADENOCARCINOMA, DUCTAL CARCINOMA IN SITU, INTERMEDIATE NUCLEAR GRADE, CRIBRIFORM TYPE, MICROCALCIFICATIONS PRESENT WITHIN DCIS AND USUAL DUCT HYPERPLASIA of the LEFT breast, upper inner, posterior depth, (x clip). This was found to be concordant by Dr. Amie Portland. Pathology revealed FOCAL ATYPICAL DUCTAL HYPERPLASIA, BACKGROUND BREAST OF PREDOMINANTLY MATURE ADIPOSE SHOWING FIBROCYSTIC CHANGES INCLUDING CYSTIC DILATATION OF DUCTS, ADENOSIS AND USUAL DUCT HYPERPLASIA, MICROCALCIFICATIONS PRESENT WITHIN ADENOSIS AND USUAL DUCT HYPERPLASIA of the LEFT breast, upper inner, middle depth, (ribbon clip). This was found to be concordant by Dr. Amie Portland, for consideration of excision. Pathology results were discussed with the patient by telephone on February 15, 2024, by [employer], [place], [place] # [id number]. The patient reported doing well after the biopsies with tenderness at the sites. Post biopsy instructions and care were reviewed and questions were answered. The patient was encouraged to call [facility] for any additional concerns. My direct number was provided. The patient was referred to [facility] on February 20, 2024. Pathology results reported by Rene Kocher, RN on 02/18/2024. Electronically Signed   By: Amie Portland M.D.   On: 02/18/2024 09:00   Result Date: 02/18/2024 CLINICAL DATA:  Patient presents for stereotactic core needle biopsy of a left breast architectural distortion and nearby calcifications. EXAM: LEFT BREAST STEREOTACTIC CORE NEEDLE BIOPSY: 2 LEFT BREAST STEREOTACTIC BIOPSIES PERFORMED. COMPARISON:  Previous exam(s). FINDINGS: The patient and I discussed the procedure of stereotactic-guided biopsy including benefits and alternatives. We discussed the high likelihood of a successful procedure. We discussed the risks of the procedure including infection, bleeding, tissue injury, clip migration, and inadequate sampling. Informed written consent was given. The usual time out protocol was performed immediately prior to the procedure. Biopsy #1: Architectural distortion, upper outer quadrant. Using sterile technique and 1% Lidocaine as local anesthetic, under stereotactic guidance, a 9 gauge vacuum assisted device was used to perform core needle biopsy of the distortion with associated calcifications in the upper inner left breast using a superior approach. Specimen radiograph was performed showing calcifications. Specimens with calcifications are identified for pathology. Lesion quadrant: Upper inner quadrant At the conclusion of the procedure, an X shaped tissue marker clip was deployed into the biopsy cavity. Biopsy #2:  Using sterile technique and 1% Lidocaine as local anesthetic, under stereotactic guidance, a 9 gauge vacuum assisted device was used to perform core needle biopsy of calcifications in the upper outer quadrant of the left breast using a superior approach. Specimen radiograph was performed showing calcifications. Specimens with calcifications are identified for pathology. Lesion quadrant: Upper inner quadrant At the conclusion of the procedure, a ribbon shaped  tissue marker clip was deployed into the biopsy cavity. Follow-up 2-view mammogram was performed and dictated separately. IMPRESSION: Stereotactic-guided biopsy of a left breast architectural distortion and associated calcifications a separate group of calcifications, both in the upper inner quadrant. No apparent complications. Electronically Signed: By: Amie Portland M.D. On: 02/14/2024 11:24   MM CLIP PLACEMENT LEFT Result Date: 02/14/2024 CLINICAL DATA:  Assess post biopsy marker clip placements following 2 stereotactic core needle biopsies of the left breast. EXAM: 3D DIAGNOSTIC LEFT MAMMOGRAM POST STEREOTACTIC BIOPSY COMPARISON:  Previous exam(s). ACR Breast Density Category b: There are scattered areas of fibroglandular density. FINDINGS: 3D Mammographic images were obtained following stereotactic guided biopsy of 2 lesions in the upper inner left breast. The X shaped post biopsy marker clip lies in the expected location of the architectural distortion in the ribbon shaped post biopsy clip, associated with a 2 cm hematoma, lies in the expected location of the more anterior calcifications. IMPRESSION: Appropriate positioning of the X and ribbon shaped biopsy marking clips at the site of biopsy in the upper inner left breast as detailed. Final Assessment: Post Procedure Mammograms for Marker Placement Electronically Signed   By: Amie Portland M.D.   On: 02/14/2024 11:36   MS 3D DIAG MAMMO UNI LT BR (aka MM) Result Date: 02/07/2024 CLINICAL DATA:  Recall from screening mammography, possible architectural distortion and calcifications in the LEFT breast at middle to posterior depth. EXAM: DIGITAL DIAGNOSTIC UNILATERAL LEFT MAMMOGRAM WITH TOMOSYNTHESIS AND CAD; ULTRASOUND LEFT BREAST LIMITED TECHNIQUE: Left digital diagnostic mammography and breast tomosynthesis was performed. The images were evaluated with computer-aided detection. ; Targeted ultrasound examination of the left breast was performed.  COMPARISON:  Previous exam(s). ACR Breast Density Category b: There are scattered areas of fibroglandular density. FINDINGS: Spot-compression CC and MLO views the possible distortion, spot magnification CC and mediolateral views of the calcifications, and a full field mediolateral view were obtained. Persistent subtle architectural distortion in the UPPER INNER QUADRANT at posterior depth on the spot compression views. No visible mass associated with the distortion. Loosely grouped round/punctate and amorphous calcifications in the UPPER INNER QUADRANT at middle to posterior depth spanning 2.6 cm, located anterior to the distortion. No suspicious linear or branching forms within this group. Second group of amorphous calcifications in the UPPER INNER QUADRANT at middle depth, located anterior and inferior to the group described above, though these calcifications are in a somewhat linear orientation directed toward the nipple. This group is located approximately 5.5 cm anterior and inferior to the distortion. Targeted ultrasound is performed in the UPPER INNER QUADRANT, demonstrating no sonographic correlate for the subtle distortion. Sonographic evaluation of the axilla demonstrates no pathologic lymphadenopathy. IMPRESSION: 1. Persistent subtle architectural distortion involving the UPPER INNER QUADRANT of the LEFT breast. 2. Indeterminate 0.6 cm group of calcifications in the UPPER INNER QUADRANT at middle depth. These calcifications are located approximately 5.5 cm anterior and inferior to the subtle distortion. 3. Likely benign loosely grouped calcifications in the UPPER INNER QUADRANT at middle to posterior depth, located immediately anterior to the distortion. 4. No pathologic LEFT axillary lymphadenopathy.  RECOMMENDATION: Stereotactic tomosynthesis core needle biopsy (x 2) of the subtle architectural distortion in the UPPER INNER QUADRANT of the LEFT breast at posterior depth and the 0.6 cm group of  calcifications in the UPPER INNER QUADRANT of the LEFT at middle depth. I have discussed the findings and recommendations with the patient. The stereotactic tomosynthesis core needle biopsy procedure was discussed with the patient and her questions were answered. She wishes to proceed with the biopsy which has been scheduled by the [facility] staff. Communication with the patient was achieved with the assistance of a certified interpreter. BI-RADS CATEGORY  4: Suspicious. Electronically Signed   By: Hulan Saas M.D.   On: 02/07/2024 12:14   [place] LIMITED ULTRASOUND INCLUDING AXILLA LEFT BREAST  Result Date: 02/07/2024 CLINICAL DATA:  Recall from screening mammography, possible architectural distortion and calcifications in the LEFT breast at middle to posterior depth. EXAM: DIGITAL DIAGNOSTIC UNILATERAL LEFT MAMMOGRAM WITH TOMOSYNTHESIS AND CAD; ULTRASOUND LEFT BREAST LIMITED TECHNIQUE: Left digital diagnostic mammography and breast tomosynthesis was performed. The images were evaluated with computer-aided detection. ; Targeted ultrasound examination of the left breast was performed. COMPARISON:  Previous exam(s). ACR Breast Density Category b: There are scattered areas of fibroglandular density. FINDINGS: Spot-compression CC and MLO views the possible distortion, spot magnification CC and mediolateral views of the calcifications, and a full field mediolateral view were obtained. Persistent subtle architectural distortion in the UPPER INNER QUADRANT at posterior depth on the spot compression views. No visible mass associated with the distortion. Loosely grouped round/punctate and amorphous calcifications in the UPPER INNER QUADRANT at middle to posterior depth spanning 2.6 cm, located anterior to the distortion. No suspicious linear or branching forms within this group. Second group of amorphous calcifications in the UPPER INNER QUADRANT at middle depth, located anterior and inferior to  the group described above, though these calcifications are in a somewhat linear orientation directed toward the nipple. This group is located approximately 5.5 cm anterior and inferior to the distortion. Targeted ultrasound is performed in the UPPER INNER QUADRANT, demonstrating no sonographic correlate for the subtle distortion. Sonographic evaluation of the axilla demonstrates no pathologic lymphadenopathy. IMPRESSION: 1. Persistent subtle architectural distortion involving the UPPER INNER QUADRANT of the LEFT breast. 2. Indeterminate 0.6 cm group of calcifications in the UPPER INNER QUADRANT at middle depth. These calcifications are located approximately 5.5 cm anterior and inferior to the subtle distortion. 3. Likely benign loosely grouped calcifications in the UPPER INNER QUADRANT at middle to posterior depth, located immediately anterior to the distortion. 4. No pathologic LEFT axillary lymphadenopathy. RECOMMENDATION: Stereotactic tomosynthesis core needle biopsy (x 2) of the subtle architectural distortion in the UPPER INNER QUADRANT of the LEFT breast at posterior depth and the 0.6 cm group of calcifications in the UPPER INNER QUADRANT of the LEFT at middle depth. I have discussed the findings and recommendations with the patient. The stereotactic tomosynthesis core needle biopsy procedure was discussed with the patient and her questions were answered. She wishes to proceed with the biopsy which has been scheduled by the [facility] staff. Communication with the patient was achieved with the assistance of a certified interpreter. BI-RADS CATEGORY  4: Suspicious. Electronically Signed   By: Hulan Saas M.D.   On: 02/07/2024 12:14       IMPRESSION/PLAN: 1. 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 Mitzi Hansen discussed the pathology findings and reviewed the nature of early  stage breast cancer. The consensus from  the breast conference includes lumpectomy followed by Oncotype testing and adjuvant radiation and antiestrogen therapy. She understands that if she needs chemotherapy, this would precede radiation. Dr. Mitzi Hansen recommends external beam radiotherapy to the breast following her lumpectomy to reduce risks of local recurrence followed by antiestrogen therapy. We discussed the risks, benefits, short, and long term effects of radiotherapy, as well as the curative intent, and the patient is interested in proceeding. Dr. Mitzi Hansen discussed the delivery and logistics of radiotherapy and anticipates a course of 4 weeks of radiotherapy to the right breast. We will see her back a few weeks after surgery to discuss the simulation process and anticipate starting radiotherapy about 4-6 weeks after surgery.   2. Possible genetic predisposition to malignancy. The patient is a candidate for genetic testing given her personal and family history. She will meet with our geneticist today in clinic.   In a visit lasting 60 minutes, greater than 50% of the time was spent face to face reviewing her case, as well as in preparation of, discussing, and coordinating the patient's care.  The above documentation reflects my direct findings during this shared patient visit. Please see the separate note by Dr. Mitzi Hansen on this date for the remainder of the patient's plan of care.    Joyice Faster, [place]    **Disclaimer: This note was dictated with voice recognition software. Similar sounding words can inadvertently be transcribed and this note may contain transcription errors which may not have been corrected upon publication of note.**

## 2024-02-20 NOTE — Therapy (Signed)
 OUTPATIENT PHYSICAL THERAPY BREAST CANCER BASELINE EVALUATION   Patient Name: Latoya Fernandez MRN: [id number] DOB:1971-03-12, 53 y.o., female Today's Date: 02/20/2024  END OF SESSION:  PT End of Session - 02/20/24 1453     Visit Number 1    Number of Visits 2    Date for PT Re-Evaluation 04/16/24    PT Start Time 1337    PT Stop Time 1414    PT Time Calculation (min) 37 min    Activity Tolerance Patient tolerated treatment well    Behavior During Therapy [facility] for tasks assessed/performed             Past Medical History:  Diagnosis Date   Breast cancer [facility])    Past Surgical History:  Procedure Laterality Date   BREAST BIOPSY Left 02/14/2024   MM LT BREAST BX W LOC DEV 1ST LESION IMAGE BX SPEC STEREO GUIDE 02/14/2024 [facility]GRAP[facility]   BREAST BIOPSY Left 02/14/2024   MM LT BREAST BX W LOC DEV EA AD LESION IMG BX SPEC STEREO GUIDE 02/14/2024 [facility]AMMO[facility]   CHOLECYSTECTOMY     Patient Active Problem List   Diagnosis Date Noted   Malignant neoplasm of upper-inner quadrant of left breast in female, estrogen receptor positive (HCC) 02/19/2024    REFERRING PROVIDER: Dr. Harriette Fernandez  REFERRING DIAG: Left breast cancer  THERAPY DIAG:  Malignant neoplasm of upper-inner quadrant of left breast in female, estrogen receptor positive (HCC)  Abnormal posture  Rationale for Evaluation and Treatment: Rehabilitation  ONSET DATE: 02/14/2024  SUBJECTIVE:                                                                                                                                                                                           SUBJECTIVE STATEMENT: Patient reports she is here today to be seen by her medical team for her newly diagnosed left breast cancer.   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 It measures 6 mm and is located in the upper inner quadrant. It is ER/PR positive and  HER2 negative with a Ki67 of 1%.   PATIENT GOALS:   reduce lymphedema risk and learn post op HEP.   PAIN:  Are you having pain? No  PRECAUTIONS: Active CA   RED FLAGS: None   HAND DOMINANCE: right  WEIGHT BEARING RESTRICTIONS: No  FALLS:  Has patient fallen in last 6 months?   LIVING ENVIRONMENT: Patient lives with: her husband, 27 and 43 y.o. sons Lives in: House/apartment Falls: No Has following equipment at home: None  OCCUPATION: Laundry service at hotel  LEISURE: She does not exercise  PRIOR LEVEL OF FUNCTION: Independent   OBJECTIVE: Note: Objective measures were completed at Evaluation unless otherwise noted.  COGNITION: Overall cognitive status: Within functional limits for tasks assessed    POSTURE:  Forward head and rounded shoulders posture  UPPER EXTREMITY AROM/PROM:  A/PROM RIGHT   eval   Shoulder extension 43  Shoulder flexion 147  Shoulder abduction 159  Shoulder internal rotation 76  Shoulder external rotation 76    (Blank rows = not tested)  A/PROM LEFT   eval  Shoulder extension 40  Shoulder flexion 139  Shoulder abduction 156  Shoulder internal rotation 83  Shoulder external rotation 70    (Blank rows = not tested)  CERVICAL AROM: All within normal limits  UPPER EXTREMITY STRENGTH: WNL  LYMPHEDEMA ASSESSMENTS (in cm):   LANDMARK RIGHT   eval  10 cm proximal to olecranon process 34.5  Olecranon process 27.1  10 cm proximal to ulnar styloid process 25.7  Just proximal to ulnar styloid process 16.8  Across hand at thumb web space 18.9  At base of 2nd digit 6.3  (Blank rows = not tested)  LANDMARK LEFT   eval  10 cm proximal to olecranon process 34.9  Olecranon process 27  10 cm proximal to ulnar styloid process 24.6  Just proximal to ulnar styloid process 17  Across hand at thumb web space 18.9  At base of 2nd digit 6  (Blank rows = not tested)  L-DEX LYMPHEDEMA SCREENING:  The patient was assessed using the  L-Dex machine today to produce a lymphedema index baseline score. The patient will be reassessed on a regular basis (typically every 3 months) to obtain new L-Dex scores. If the score is > 6.5 points away from his/her baseline score indicating onset of subclinical lymphedema, it will be recommended to wear a compression garment for 4 weeks, 12 hours per day and then be reassessed. If the score continues to be > 6.5 points from baseline at reassessment, we will initiate lymphedema treatment. Assessing in this manner has a 95% rate of preventing clinically significant lymphedema.   L-DEX FLOWSHEETS - 02/20/24 1400       L-DEX LYMPHEDEMA SCREENING   Measurement Type Unilateral    L-DEX MEASUREMENT EXTREMITY Upper Extremity    POSITION  Standing    DOMINANT SIDE Right    At Risk Side Left    BASELINE SCORE (UNILATERAL) -0.3             QUICK DASH SURVEY:  Latoya Fernandez - 02/20/24 0001     Open a tight or new jar No difficulty    Do heavy household chores (wash walls, wash floors) No difficulty    Carry a shopping bag or briefcase No difficulty    Wash your back No difficulty    Use a knife to cut food No difficulty    Recreational activities in which you take some force or impact through your arm, shoulder, or hand (golf, hammering, tennis) No difficulty    During the past week, to what extent has your arm, shoulder or hand problem interfered with your normal social activities with family, friends, neighbors, or groups? Not at all    During the past week, to what extent has your arm, shoulder or hand problem limited your work or other regular daily activities Not at all    Arm, shoulder, or hand pain. None    Tingling (pins and needles) in your arm, shoulder, or hand None  Difficulty Sleeping No difficulty    DASH Score 0 %              PATIENT EDUCATION:  Education details: Time spent educating patient on aspects of self-care to maximize post op recovery. Patient was educated  on where and how to get a post op compression bra to use to reduce post op edema. Patient was also educated on the use of SOZO screenings and surveillance principles for early identification of lymphedema onset. She was instructed to use the post op pillow in the axilla for pressure and pain relief. Patient educated on lymphedema risk reduction and post op shoulder/posture HEP. Person educated: Patient Education method: Explanation, Demonstration, Handout Education comprehension: Patient verbalized understanding and returned demonstration  HOME EXERCISE PROGRAM: Patient was instructed today in a home exercise program today for post op shoulder range of motion. These included active assist shoulder flexion in sitting, scapular retraction, wall walking with shoulder abduction, and hands behind head external rotation.  She was encouraged to do these twice a day, holding 3 seconds and repeating 5 times when permitted by her physician.   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 It measures 6 mm and is located in the upper inner quadrant. It is ER/PR positive and HER2 negative with a Ki67 of 1%. Her multidisciplinary medical team met prior to her assessments to determine a recommended treatment plan. She is planning to have a left lumpectomy and sentinel node biopsy followed by Oncotype testing, radiation, and anti-estrogen therapy. She will benefit from a post op PT reassessment to determine needs and from L-Dex screens every 3 months for 2 years to detect subclinical lymphedema.  Pt will benefit from skilled therapeutic intervention to improve on the following deficits: Decreased knowledge of precautions, impaired UE functional use, pain, decreased ROM, postural dysfunction.   PT treatment/interventions: ADL/self-care home management, pt/family education, therapeutic exercise  REHAB POTENTIAL:  Excellent  CLINICAL DECISION MAKING: Stable/uncomplicated  EVALUATION COMPLEXITY: Low   GOALS: Goals reviewed with patient? YES  LONG TERM GOALS: (STG=LTG)    Name Target Date Goal status  1 Pt will be able to verbalize understanding of pertinent lymphedema risk reduction practices relevant to her dx specifically related to skin care.  Baseline:  No knowledge 02/20/2024 Achieved at eval  2 Pt will be able to return demo and/or verbalize understanding of the post op HEP related to regaining shoulder ROM. Baseline:  No knowledge 02/20/2024 Achieved at eval  3 Pt will be able to verbalize understanding of the importance of viewing the post op After Breast CA Class video for further lymphedema risk reduction education and therapeutic exercise.  Baseline:  No knowledge 02/20/2024 Achieved at eval  4 Pt will demo she has regained full shoulder ROM and function post operatively compared to baselines.  Baseline: See objective measurements taken today. 04/16/2024     PLAN:  PT FREQUENCY/DURATION: EVAL and 1 follow up appointment.   PLAN FOR NEXT SESSION: will reassess 3-4 weeks post op to determine needs.   Patient will follow up at outpatient cancer rehab 3-4 weeks following surgery.  If the patient requires physical therapy at that time, a specific plan will be dictated and sent to the referring physician for approval. The patient was educated today on appropriate basic range of motion exercises to begin post operatively and the importance of viewing the After Breast Cancer class video following surgery.  Patient was educated  today on lymphedema risk reduction practices as it pertains to recommendations that will benefit the patient immediately following surgery.  She verbalized good understanding.    Physical Therapy Information for After Breast Cancer Surgery/Treatment:  Lymphedema is a swelling condition that you may be at risk for in your arm if you have lymph nodes removed from the armpit  area.  After a sentinel node biopsy, the risk is approximately 5-9% and is higher after an axillary node dissection.  There is treatment available for this condition and it is not life-threatening.  Contact your physician or physical therapist with concerns. You may begin the 4 shoulder/posture exercises (see additional sheet) when permitted by your physician (typically a week after surgery).  If you have drains, you may need to wait until those are removed before beginning range of motion exercises.  A general recommendation is to not lift your arms above shoulder height until drains are removed.  These exercises should be done to your tolerance and gently.  This is not a "no pain/no gain" type of recovery so listen to your body and stretch into the range of motion that you can tolerate, stopping if you have pain.  If you are having immediate reconstruction, ask your plastic surgeon about doing exercises as he or she may want you to wait. We encourage you to attend the free one time ABC (After Breast Cancer) class offered by [facility].  You will learn information related to lymphedema risk, prevention and treatment and additional exercises to regain mobility following surgery.  You can call [phone] for more information.  This is offered the 1st and 3rd Monday of each month.  You only attend the class one time. While undergoing any medical procedure or treatment, try to avoid blood pressure being taken or needle sticks from occurring on the arm on the side of cancer.   This recommendation begins after surgery and continues for the rest of your life.  This may help reduce your risk of getting lymphedema (swelling in your arm). An excellent resource for those seeking information on lymphedema is the National Lymphedema Network's web site. It can be accessed at www.lymphnet.org If you notice swelling in your hand, arm or breast at any time following surgery (even if it is many years  from now), please contact your doctor or physical therapist to discuss this.  Lymphedema can be treated at any time but it is easier for you if it is treated early on.  If you feel like your shoulder motion is not returning to normal in a reasonable amount of time, please contact your surgeon or physical therapist.  [facility] [phone]. [address], [place] [address]  ABC CLASS After Breast Cancer Class  After Breast Cancer Class is a specially designed exercise class video to assist you in a safe recover after having breast cancer surgery.  In this video you will learn how to get back to full function whether your drains were just removed or if you had surgery a month ago. The video can be viewed on this page: https://www.boyd-meyer.org/ or on YouTube here: https://[email or web address].  Class Goals  Understand specific stretches to improve the flexibility of you chest and shoulder. Learn ways to safely strengthen your upper body and improve your posture. Understand the warning signs of infection and why you may be at risk for an arm infection. Learn about Lymphedema and prevention.  ** You do not need to view  this video until after surgery.  Drains should be removed to participate in the recommended exercises on the video.  Patient was instructed today in a home exercise program today for post op shoulder range of motion. These included active assist shoulder flexion in sitting, scapular retraction, wall walking with shoulder abduction, and hands behind head external rotation.  She was encouraged to do these twice a day, holding 3 seconds and repeating 5 times when permitted by her physician.  Bethann Punches, [place] 02/20/24 3:53 PM

## 2024-02-20 NOTE — Progress Notes (Unsigned)
 REFERRING PROVIDER: Serena Croissant, MD [address] [place],  [place] [address]  PRIMARY PROVIDER:  None listed  PRIMARY REASON FOR VISIT:  Encounter Diagnosis  Name Primary?   Malignant neoplasm of upper-inner quadrant of left breast in female, estrogen receptor positive (HCC) Yes     HISTORY OF PRESENT ILLNESS:   Latoya Fernandez, a 53 y.o. female, was seen for a [place] cancer genetics consultation during the breast multidisciplinary clinic at the request of Dr. Pamelia Hoit due to a personal history of breast cancer.  Latoya Fernandez presents to clinic today to discuss the possibility of a hereditary predisposition to cancer, to discuss genetic testing, and to further clarify her future cancer risks, as well as potential cancer risks for family members.   In March 2025, at the age of 56, Latoya Fernandez was diagnosed with invasive ductal carcinoma of the left breast (ER+/PR+/HER2-). The treatment plan is pending.   CANCER HISTORY:  Oncology History  Malignant neoplasm of upper-inner quadrant of left breast in female, estrogen receptor positive (HCC)  02/14/2024 Initial Diagnosis   Screening mammogram detected left breast distortion and calcifications measuring 0.6 cm, stereotactic biopsy: Grade 2 IDC with DCIS intermediate grade ER 95%, PR 100%, Ki67 1%, HER2 negative by FISH, second biopsy: ADH   02/20/2024 Cancer Staging   Staging form: Breast, AJCC 8th Edition - Clinical: Stage IA (cT1b, cN0, cM0, G2, ER+, PR+, HER2-) - Signed by Serena Croissant, MD on 02/20/2024 Stage prefix: Initial diagnosis Histologic grading system: 3 grade system      Past Medical History:  Diagnosis Date   Breast cancer [facility])     Past Surgical History:  Procedure Laterality Date   BREAST BIOPSY Left 02/14/2024   MM LT BREAST BX W LOC DEV 1ST LESION IMAGE BX SPEC STEREO GUIDE 02/14/2024 [facility]AMMOGR[facility]   BREAST BIOPSY Left 02/14/2024   MM LT BREAST BX W LOC DEV EA AD LESION IMG BX SPEC STEREO GUIDE  02/14/2024 [facility]AMMO[facility]   CHOLECYSTECTOMY       FAMILY HISTORY:  We obtained a detailed, 4-generation family history.  She was unaware of any cancer diagnoses in her family.  Family History  Problem Relation Age of Onset   Breast cancer Neg Hx      Latoya Fernandez is unaware of previous family history of genetic testing for hereditary cancer risks.   There is no reported Ashkenazi Jewish ancestry. There is no known consanguinity.  GENETIC COUNSELING ASSESSMENT: Latoya Fernandez is a 53 y.o. female with a personal history of cancer which is somewhat suggestive of a hereditary cancer syndrome and predisposition to cancer given her age of diagnosis. We, therefore, discussed and recommended the following at today's visit.   DISCUSSION: We discussed that 5 - 10% of cancer is hereditary, with most cases of hereditary breast cancer associated with mutations in BRCA1/2.  There are other genes that can be associated with hereditary breast and other cancer syndromes.  Type of cancer risk and level of risk are gene-specific. We discussed that testing is beneficial for several reasons including knowing how to follow individuals after completing their treatment, identifying whether potential treatment options would be beneficial, and understanding if other family members could be at risk for cancer and allowing them to undergo genetic testing.   We reviewed the characteristics, features and inheritance patterns of hereditary cancer syndromes. We also discussed genetic testing, including the appropriate family members to test, the process of testing, insurance coverage and turn-around-time for results. We discussed the  implications of a negative, positive and/or variant of uncertain significant result. In order to get genetic test results in a timely manner, we recommended Latoya Fernandez pursue genetic testing for the [facility] BRCAPlus Panel.  The BRCAPlus gene panel offered by [facility] and includes sequencing and  rearrangement analysis for the following 13 genes: ATM, BARD1, BRCA1, BRCA2, CDH1, CHEK2, NF1, PALB2, PTEN, RAD51C, RAD51D, STK11 and TP53. Once complete, we recommend Latoya Fernandez pursue reflex genetic testing to a more comprehensive gene panel.   The [facility] CancerNext+RNAinsight Panel includes sequencing, rearrangement analysis, and RNA analysis for the following 39 genes: APC, ATM, BAP1, BARD1, BMPR1A, BRCA1, BRCA2, BRIP1, CDH1, CDKN2A, CHEK2, FH, FLCN, MET, MLH1, MSH2, MSH6, MUTYH, NF1, NTHL1, PALB2, PMS2, PTEN, RAD51C, RAD51D, SMAD4, STK11, TP53, TSC1, TSC2, and VHL (sequencing and deletion/duplication); AXIN2, HOXB13, MBD4, MSH3, POLD1 and POLE (sequencing only); EPCAM and GREM1 (deletion/duplication only).   Based on Latoya Fernandez's personal history of cancer, she meets American Society of Breast Surgeons criteria for genetic testing. Per NCCN, genetic testing can be considered for those under the age of 60.  Gratis testing requested through [employer]   PLAN: After considering the risks, benefits, and limitations, Latoya Fernandez provided informed consent to pursue genetic testing and the blood sample was sent to [facility] for analysis of the BRCAPlus and CancerNext +RNAinsight Panel. Results should be available within approximately 1-2 weeks' time, at which point they will be disclosed by telephone to Latoya Fernandez, as will any additional recommendations warranted by these results. Latoya Fernandez will receive a summary of her genetic counseling visit and a copy of her results once available. This information will also be available in Epic.    Latoya Fernandez questions were answered to her satisfaction today. Our contact information was provided should additional questions or concerns arise. Thank you for the referral and allowing [place] to share in the care of your patient.   Latoya Kniskern M. Rennie Plowman, MS, [facility] Genetic Counselor [email or web address] (P) [phone]   35 minutes were spent on the date of  the encounter in service to the patient including preparation, face-to-face consultation, documentation and care coordination.  The patient was accompanied by her husband.  Session conducted with assistnace of Spanish interpreter.  Dr. Pamelia Hoit was available to discuss this case as needed.   _______________________________________________________________________ For Office Staff:  Number of people involved in session: 2 Was an Intern/ student involved with case: no

## 2024-02-20 NOTE — Assessment & Plan Note (Signed)
 02/14/2024:Screening mammogram detected left breast distortion and calcifications measuring 0.6 cm, stereotactic biopsy: Grade 2 IDC with DCIS intermediate grade ER 95%, PR 100%, Ki67 1%, HER2 negative by FISH, second biopsy: ADH  Pathology and radiology counseling:Discussed with the patient, the details of pathology including the type of breast cancer,the clinical staging, the significance of ER, PR and HER-2/neu receptors and the implications for treatment. After reviewing the pathology in detail, we proceeded to discuss the different treatment options between surgery, radiation, chemotherapy, antiestrogen therapies.  Recommendations: 1. Breast conserving surgery followed by 2. Oncotype DX testing to determine if chemotherapy would be of any benefit followed by 3. Adjuvant radiation therapy followed by 4. Adjuvant antiestrogen therapy  Oncotype counseling: I discussed Oncotype DX test. I explained to the patient that this is a 21 gene panel to evaluate patient tumors DNA to calculate recurrence score. This would help determine whether patient has high risk or low risk breast cancer. She understands that if her tumor was found to be high risk, she would benefit from systemic chemotherapy. If low risk, no need of chemotherapy.  Return to clinic after surgery to discuss final pathology report and then determine if Oncotype DX testing will need to be sent.

## 2024-02-20 NOTE — Progress Notes (Signed)
 [facility] Multidisciplinary Clinic Spiritual Care Note  Met with Latoya Fernandez and her partner in Breast Multidisciplinary Clinic to introduce Support Center team/resources.  she completed SDOH screening; results follow below.    SDOH Screenings   Food Insecurity: No Food Insecurity (02/20/2024)  Housing: Unknown (02/20/2024)  Transportation Needs: No Transportation Needs (02/20/2024)  Utilities: Not At Risk (02/20/2024)  Depression (PHQ2-9): Low Risk  (02/20/2024)  Tobacco Use: Low Risk  (02/20/2024)   Chaplain and patient discussed common feelings and emotions when being diagnosed with cancer, and the importance of support during treatment.  Chaplain informed patient of the support team and support services at [facility].  Chaplain provided contact information and encouraged patient to call with any questions or concerns.  [name] reports that attending [facility] helped reduce her distress. She reports good support from her partner, who is also worried about her diagnosis.    Follow up needed: Yes.  We plan to follow up by phone in ca 2 weeks with assistance from telephonic interpreter.   [address] [place], [place], [facility] Pager [phone] Voicemail [phone]

## 2024-02-21 ENCOUNTER — Telehealth: Payer: Self-pay

## 2024-02-21 NOTE — Telephone Encounter (Signed)
 Via Delorise Royals, [facility] Spanish Interpreter, Patient was contacted to determine if she is eligible to apply for [facility] as she was diagnosed with breast cancer. Patient stated that she is not a [place] citizen, and was informed, [employer] application will be mailed to her. Patient verbalized understanding.

## 2024-02-21 NOTE — Addendum Note (Signed)
 Encounter addended by: Dorothy Puffer, MD on: 02/21/2024 12:28 PM  Actions taken: Clinical Note Signed

## 2024-02-22 ENCOUNTER — Telehealth: Payer: Self-pay | Admitting: *Deleted

## 2024-02-22 ENCOUNTER — Encounter: Payer: Self-pay | Admitting: *Deleted

## 2024-02-22 ENCOUNTER — Inpatient Hospital Stay: Admitting: Licensed Clinical Social Worker

## 2024-02-22 NOTE — Telephone Encounter (Signed)
 Via spanish interpreter Raynelle Fanning concerning [facility] from 02/20/24. Denies questions or concerns regarding dx or treatment care plan. Encourage pt to call with needs. Pt connected with Sherie in [employer] and Marcelino Duster SW for [employer].

## 2024-02-22 NOTE — Progress Notes (Signed)
 [facility] Clinical Social Work  Clinical Social Work was referred by Statistician for assessment of psychosocial needs.  Clinical Social Worker contacted patient by phone with Spanish interpreter ([employer] interpreter ID # [phone]) to offer support and assess for needs.   Patient reports feeling better emotionally after [facility] visit and learning more about her diagnosis and plan.  Her biggest concern are the medical expenses as she is uninsured and unable to get Medicaid.  [employer] financial assistance application was sent in the mail yesterday by S. McGill. CSW also encouraged pt to complete application for assistance for [facility] as the surgeon is through [place].  Pt voiced understanding.  CSW offered to assist with applications for non-medical needs to cancer foundations. Pt declined at this time as her children's father is helping with rent and other expenses.  Pt currently lives with her two teenage sons and is the sole earner (working part-time currently).  Pt did agree to a referral to the [employer] through [place] center for new [employer]. CSW placed referral today.   CSW provided direct contact information should pt have other resource needs.  Pt is also being followed by chaplain, Primitivo Gauze, for coping support.    Isrrael Fluckiger E Dashanae Longfield, LCSW  Clinical Social Worker [facility]

## 2024-02-25 ENCOUNTER — Other Ambulatory Visit: Payer: Self-pay | Admitting: Surgery

## 2024-02-25 DIAGNOSIS — C50912 Malignant neoplasm of unspecified site of left female breast: Secondary | ICD-10-CM

## 2024-02-26 ENCOUNTER — Encounter: Payer: Self-pay | Admitting: Family Medicine

## 2024-03-04 ENCOUNTER — Telehealth: Payer: Self-pay | Admitting: Genetic Counselor

## 2024-03-04 ENCOUNTER — Encounter: Payer: Self-pay | Admitting: Genetic Counselor

## 2024-03-04 ENCOUNTER — Encounter (HOSPITAL_BASED_OUTPATIENT_CLINIC_OR_DEPARTMENT_OTHER): Payer: Self-pay | Admitting: Surgery

## 2024-03-04 DIAGNOSIS — Z1379 Encounter for other screening for genetic and chromosomal anomalies: Secondary | ICD-10-CM | POA: Insufficient documentation

## 2024-03-04 NOTE — Telephone Encounter (Signed)
 Contacted patient in attempt to disclose results of genetic testing.  LVM with contact information requesting a call back.  Language Line interpreter ID [phone]

## 2024-03-05 ENCOUNTER — Ambulatory Visit: Payer: Self-pay | Admitting: Genetic Counselor

## 2024-03-05 ENCOUNTER — Telehealth: Payer: Self-pay | Admitting: Hematology and Oncology

## 2024-03-05 DIAGNOSIS — Z170 Estrogen receptor positive status [ER+]: Secondary | ICD-10-CM

## 2024-03-05 DIAGNOSIS — Z1379 Encounter for other screening for genetic and chromosomal anomalies: Secondary | ICD-10-CM

## 2024-03-05 MED ORDER — CHLORHEXIDINE GLUCONATE CLOTH 2 % EX PADS: 6.0000 | MEDICATED_PAD | Freq: Once | CUTANEOUS | Status: DC

## 2024-03-05 NOTE — Progress Notes (Signed)
 HPI:   Latoya Fernandez was previously seen in the [facility] clinic due to a personal history of breast cancer and concerns regarding a hereditary predisposition to cancer.    Ms. Forbush recent genetic test results were disclosed to her by telephone (Language Line interpreter ID [phone]). These results and recommendations are discussed in more detail below.  CANCER HISTORY:  Oncology History  Malignant neoplasm of upper-inner quadrant of left breast in female, estrogen receptor positive (HCC)  02/14/2024 Initial Diagnosis   Screening mammogram detected left breast distortion and calcifications measuring 0.6 cm, stereotactic biopsy: Grade 2 IDC with DCIS intermediate grade ER 95%, PR 100%, Ki67 1%, HER2 negative by FISH, second biopsy: ADH   02/20/2024 Cancer Staging   Staging form: Breast, AJCC 8th Edition - Clinical: Stage IA (cT1b, cN0, cM0, G2, ER+, PR+, HER2-) - Signed by Cameron Cea, MD on 02/20/2024 Stage prefix: Initial diagnosis Histologic grading system: 3 grade system   02/29/2024 Genetic Testing   Negative [facility] CancerNext +RNAinsight Panel.  Report date is 02/29/2024.   The [facility] CancerNext+RNAinsight Panel includes sequencing, rearrangement analysis, and RNA analysis for the following 39 genes: APC, ATM, BAP1, BARD1, BMPR1A, BRCA1, BRCA2, BRIP1, CDH1, CDKN2A, CHEK2, FH, FLCN, MET, MLH1, MSH2, MSH6, MUTYH, NF1, NTHL1, PALB2, PMS2, PTEN, RAD51C, RAD51D, SMAD4, STK11, TP53, TSC1, TSC2, and VHL (sequencing and deletion/duplication); AXIN2, HOXB13, MBD4, MSH3, POLD1 and POLE (sequencing only); EPCAM and GREM1 (deletion/duplication only).     FAMILY HISTORY:  We obtained a detailed, 4-generation family history.  Significant diagnoses are listed below: Family History  Problem Relation Age of Onset   Breast cancer Neg Hx       Ms. Wernli is unaware of previous family history of genetic testing for hereditary cancer risks.    There is no reported Ashkenazi Jewish  ancestry. There is no known consanguinity.  GENETIC TEST RESULTS:  The [facility] CancerNext+RNAinsight Panel found no pathogenic mutations.   The [facility] CancerNext+RNAinsight Panel includes sequencing, rearrangement analysis, and RNA analysis for the following 39 genes: APC, ATM, BAP1, BARD1, BMPR1A, BRCA1, BRCA2, BRIP1, CDH1, CDKN2A, CHEK2, FH, FLCN, MET, MLH1, MSH2, MSH6, MUTYH, NF1, NTHL1, PALB2, PMS2, PTEN, RAD51C, RAD51D, SMAD4, STK11, TP53, TSC1, TSC2, and VHL (sequencing and deletion/duplication); AXIN2, HOXB13, MBD4, MSH3, POLD1 and POLE (sequencing only); EPCAM and GREM1 (deletion/duplication only).   The test report has been scanned into EPIC and is located under the Molecular Pathology section of the Results Review tab.  A portion of the result report is included below for reference. Genetic testing reported out on February 29, 2024.       Even though a pathogenic variant was not identified, possible explanations for the cancer in the family may include: There may be no hereditary risk for cancer in the family. The cancers in Ms. Knipfer and/or her family may be sporadic/familial or due to other genetic and environmental factors.  Most cancer is not hereditary.  There may be a gene mutation in one of these genes that current testing methods cannot detect but that chance is small. There could be another gene that has not yet been discovered, or that we have not yet tested, that is responsible for the cancer diagnoses in the family.    Therefore, it is important to remain in touch with cancer genetics in the future so that we can continue to offer Ms. Pevey the most up to date genetic testing.    ADDITIONAL GENETIC TESTING:   Ms. Harries genetic testing was fairly extensive.  If there are additional relevant genes identified to increase cancer risk that can be analyzed in the future, we would be happy to discuss and coordinate this testing at that time.    CANCER SCREENING RECOMMENDATIONS:   Ms. Nielson test result is considered negative (normal).  This means that we have not identified a hereditary cause for her personal history of breast cancer at this time.   An individual's cancer risk and medical management are not determined by genetic test results alone. Overall cancer risk assessment incorporates additional factors, including personal medical history, family history, and any available genetic information that may result in a personalized plan for cancer prevention and surveillance. Therefore, it is recommended she continue to follow the cancer management and screening guidelines provided by her oncology and primary healthcare provider.  RECOMMENDATIONS FOR FAMILY MEMBERS:   Since she did not inherit a identifiable mutation in a cancer predisposition gene included on this panel, her children could not have inherited a known mutation from her in one of these genes. Individuals in this family might be at some increased risk of developing cancer, over the general population risk, due to the family history of cancer.  Individuals in the family should notify their providers of the family history of cancer. We recommend women in this family have a yearly mammogram beginning at age 73, or 42 years younger than the earliest onset of cancer, an annual clinical breast exam, and perform monthly breast self-exams.  Risk models that take into account family history and hormonal history may be helpful in determining appropriate breast cancer screening options for family members.    FOLLOW-UP:  Cancer genetics is a rapidly advancing field and it is possible that new genetic tests will be appropriate for her and/or her family members in the future. We encourage Ms. Spagna to remain in contact with cancer genetics, so we can update her personal and family histories and let her know of advances in cancer genetics that may benefit this family.   Our contact number was provided.  They are welcome to  call us  at anytime with additional questions or concerns.   Alexi Dorminey M. Ora Billing, MS, [facility] Genetic Counselor [email or web address] (P) [phone]

## 2024-03-05 NOTE — Telephone Encounter (Signed)
 Patient scheduled appointments. Patient is aware of all appointment details.

## 2024-03-05 NOTE — Progress Notes (Signed)

## 2024-03-10 ENCOUNTER — Encounter

## 2024-03-11 ENCOUNTER — Ambulatory Visit
Admission: RE | Admit: 2024-03-11 | Discharge: 2024-03-11 | Disposition: A | Discharge status: Home / Self Care | Source: Ambulatory Visit | Attending: Surgery | Admitting: Surgery

## 2024-03-11 ENCOUNTER — Other Ambulatory Visit: Payer: Self-pay | Admitting: Surgery

## 2024-03-11 DIAGNOSIS — C50912 Malignant neoplasm of unspecified site of left female breast: Secondary | ICD-10-CM

## 2024-03-11 HISTORY — PX: BREAST BIOPSY: SHX20

## 2024-03-12 ENCOUNTER — Ambulatory Visit (HOSPITAL_BASED_OUTPATIENT_CLINIC_OR_DEPARTMENT_OTHER)
Admission: RE | Admit: 2024-03-12 | Discharge: 2024-03-12 | Disposition: A | Discharge status: Home / Self Care | Attending: Surgery | Admitting: Surgery

## 2024-03-12 ENCOUNTER — Ambulatory Visit (HOSPITAL_BASED_OUTPATIENT_CLINIC_OR_DEPARTMENT_OTHER): Admitting: Certified Registered"

## 2024-03-12 ENCOUNTER — Other Ambulatory Visit: Payer: Self-pay

## 2024-03-12 ENCOUNTER — Encounter (HOSPITAL_BASED_OUTPATIENT_CLINIC_OR_DEPARTMENT_OTHER)
Admission: RE | Disposition: A | Discharge status: Home / Self Care | Payer: Self-pay | Source: Home / Self Care | Attending: Surgery

## 2024-03-12 ENCOUNTER — Ambulatory Visit
Admission: RE | Admit: 2024-03-12 | Discharge: 2024-03-12 | Disposition: A | Discharge status: Home / Self Care | Source: Ambulatory Visit | Attending: Surgery | Admitting: Surgery

## 2024-03-12 ENCOUNTER — Encounter (HOSPITAL_BASED_OUTPATIENT_CLINIC_OR_DEPARTMENT_OTHER): Payer: Self-pay | Admitting: Surgery

## 2024-03-12 DIAGNOSIS — Z170 Estrogen receptor positive status [ER+]: Secondary | ICD-10-CM

## 2024-03-12 DIAGNOSIS — C50912 Malignant neoplasm of unspecified site of left female breast: Secondary | ICD-10-CM

## 2024-03-12 DIAGNOSIS — C50212 Malignant neoplasm of upper-inner quadrant of left female breast: Secondary | ICD-10-CM

## 2024-03-12 DIAGNOSIS — Z01818 Encounter for other preprocedural examination: Secondary | ICD-10-CM

## 2024-03-12 HISTORY — PX: BREAST LUMPECTOMY WITH RADIOACTIVE SEED AND SENTINEL LYMPH NODE BIOPSY: SHX6550

## 2024-03-12 SURGERY — BREAST LUMPECTOMY WITH RADIOACTIVE SEED AND SENTINEL LYMPH NODE BIOPSY
Anesthesia: General | Site: Breast | Laterality: Left

## 2024-03-12 MED ORDER — IBUPROFEN 800 MG PO TABS
800.0000 mg | ORAL_TABLET | Freq: Three times a day (TID) | ORAL | 0 refills | Status: AC | PRN
Start: 2024-03-12 — End: ?

## 2024-03-12 MED ORDER — BUPIVACAINE-EPINEPHRINE (PF) 0.25% -1:200000 IJ SOLN
INTRAMUSCULAR | Status: DC | PRN
Start: 2024-03-12 — End: 2024-03-12
  Administered 2024-03-12: 14 mL

## 2024-03-12 MED ORDER — OXYCODONE HCL 5 MG PO TABS
ORAL_TABLET | ORAL | Status: AC
  Filled 2024-03-12: qty 1

## 2024-03-12 MED ORDER — 0.9 % SODIUM CHLORIDE (POUR BTL) OPTIME
TOPICAL | Status: DC | PRN
  Administered 2024-03-12: 1000 mL

## 2024-03-12 MED ORDER — PROPOFOL 10 MG/ML IV BOLUS
INTRAVENOUS | Status: DC | PRN
  Administered 2024-03-12: 200 mg via INTRAVENOUS

## 2024-03-12 MED ORDER — LIDOCAINE HCL (CARDIAC) PF 100 MG/5ML IV SOSY
PREFILLED_SYRINGE | INTRAVENOUS | Status: DC | PRN
  Administered 2024-03-12: 40 mg via INTRAVENOUS

## 2024-03-12 MED ORDER — FENTANYL CITRATE (PF) 100 MCG/2ML IJ SOLN
INTRAMUSCULAR | Status: DC | PRN
Start: 2024-03-12 — End: 2024-03-12
  Administered 2024-03-12: 25 ug via INTRAVENOUS

## 2024-03-12 MED ORDER — FENTANYL CITRATE (PF) 100 MCG/2ML IJ SOLN
INTRAMUSCULAR | Status: AC
  Filled 2024-03-12: qty 2

## 2024-03-12 MED ORDER — DROPERIDOL 2.5 MG/ML IJ SOLN: 0.6250 mg | Freq: Once | INTRAMUSCULAR | Status: DC | PRN

## 2024-03-12 MED ORDER — CEFAZOLIN SODIUM-DEXTROSE 3-4 GM/150ML-% IV SOLN
3.0000 g | INTRAVENOUS | Status: DC
Start: 2024-03-12 — End: 2024-03-12

## 2024-03-12 MED ORDER — ROPIVACAINE HCL 5 MG/ML IJ SOLN
INTRAMUSCULAR | Status: DC | PRN
  Administered 2024-03-12: 30 mL

## 2024-03-12 MED ORDER — CLONIDINE HCL (ANALGESIA) 100 MCG/ML EP SOLN
EPIDURAL | Status: DC | PRN
  Administered 2024-03-12: 50 ug

## 2024-03-12 MED ORDER — GABAPENTIN 300 MG PO CAPS
ORAL_CAPSULE | ORAL | Status: AC
  Filled 2024-03-12: qty 1

## 2024-03-12 MED ORDER — OXYCODONE HCL 5 MG/5ML PO SOLN: 5.0000 mg | Freq: Once | ORAL | Status: AC | PRN

## 2024-03-12 MED ORDER — CEFAZOLIN SODIUM-DEXTROSE 2-3 GM-%(50ML) IV SOLR
INTRAVENOUS | Status: DC | PRN
  Administered 2024-03-12: 2 g via INTRAVENOUS

## 2024-03-12 MED ORDER — OXYCODONE HCL 5 MG PO TABS: 5.0000 mg | ORAL_TABLET | Freq: Four times a day (QID) | ORAL | 0 refills | Status: AC | PRN

## 2024-03-12 MED ORDER — DEXAMETHASONE SODIUM PHOSPHATE 10 MG/ML IJ SOLN
INTRAMUSCULAR | Status: AC
  Filled 2024-03-12: qty 1

## 2024-03-12 MED ORDER — MIDAZOLAM HCL 2 MG/2ML IJ SOLN
2.0000 mg | Freq: Once | INTRAMUSCULAR | Status: AC
  Administered 2024-03-12: 2 mg via INTRAVENOUS

## 2024-03-12 MED ORDER — OXYCODONE HCL 5 MG PO TABS
5.0000 mg | ORAL_TABLET | Freq: Once | ORAL | Status: AC | PRN
  Administered 2024-03-12: 5 mg via ORAL

## 2024-03-12 MED ORDER — FENTANYL CITRATE (PF) 100 MCG/2ML IJ SOLN
50.0000 ug | Freq: Once | INTRAMUSCULAR | Status: AC
  Administered 2024-03-12: 50 ug via INTRAVENOUS

## 2024-03-12 MED ORDER — LACTATED RINGERS IV SOLN: INTRAVENOUS | Status: DC

## 2024-03-12 MED ORDER — FENTANYL CITRATE (PF) 100 MCG/2ML IJ SOLN
25.0000 ug | INTRAMUSCULAR | Status: DC | PRN
  Administered 2024-03-12: 25 ug via INTRAVENOUS
  Administered 2024-03-12: 50 ug via INTRAVENOUS

## 2024-03-12 MED ORDER — ONDANSETRON HCL 4 MG/2ML IJ SOLN
INTRAMUSCULAR | Status: DC | PRN
  Administered 2024-03-12: 4 mg via INTRAVENOUS

## 2024-03-12 MED ORDER — MIDAZOLAM HCL 2 MG/2ML IJ SOLN
INTRAMUSCULAR | Status: AC
  Filled 2024-03-12: qty 2

## 2024-03-12 MED ORDER — CEFAZOLIN SODIUM-DEXTROSE 2-4 GM/100ML-% IV SOLN
INTRAVENOUS | Status: AC
  Filled 2024-03-12: qty 100

## 2024-03-12 MED ORDER — ACETAMINOPHEN 500 MG PO TABS
1000.0000 mg | ORAL_TABLET | ORAL | Status: AC
  Administered 2024-03-12: 1000 mg via ORAL

## 2024-03-12 MED ORDER — MAGTRACE LYMPHATIC TRACER
INTRAMUSCULAR | Status: DC | PRN
  Administered 2024-03-12: 2 mL via INTRAMUSCULAR

## 2024-03-12 MED ORDER — EPHEDRINE SULFATE (PRESSORS) 50 MG/ML IJ SOLN
INTRAMUSCULAR | Status: DC | PRN
  Administered 2024-03-12 (×5): 5 mg via INTRAVENOUS

## 2024-03-12 MED ORDER — DEXAMETHASONE SODIUM PHOSPHATE 10 MG/ML IJ SOLN
INTRAMUSCULAR | Status: DC | PRN
  Administered 2024-03-12: 10 mg via INTRAVENOUS

## 2024-03-12 MED ORDER — ONDANSETRON HCL 4 MG/2ML IJ SOLN
INTRAMUSCULAR | Status: AC
  Filled 2024-03-12: qty 2

## 2024-03-12 MED ORDER — GABAPENTIN 300 MG PO CAPS
300.0000 mg | ORAL_CAPSULE | ORAL | Status: AC
Start: 2024-03-12 — End: 2024-03-12
  Administered 2024-03-12: 300 mg via ORAL

## 2024-03-12 MED ORDER — PROPOFOL 10 MG/ML IV BOLUS
INTRAVENOUS | Status: AC
  Filled 2024-03-12: qty 20

## 2024-03-12 MED ORDER — ACETAMINOPHEN 500 MG PO TABS
ORAL_TABLET | ORAL | Status: AC
  Filled 2024-03-12: qty 2

## 2024-03-12 MED ORDER — EPHEDRINE 5 MG/ML INJ
INTRAVENOUS | Status: AC
  Filled 2024-03-12: qty 5

## 2024-03-12 SURGICAL SUPPLY — 41 items
BINDER BREAST LRG (GAUZE/BANDAGES/DRESSINGS) IMPLANT
BINDER BREAST MEDIUM (GAUZE/BANDAGES/DRESSINGS) IMPLANT
BINDER BREAST XLRG (GAUZE/BANDAGES/DRESSINGS) IMPLANT
BINDER BREAST XXLRG (GAUZE/BANDAGES/DRESSINGS) IMPLANT
BLADE SURG 15 STRL LF DISP TIS (BLADE) ×1 IMPLANT
CANISTER SUC SOCK COL 7IN (MISCELLANEOUS) IMPLANT
CANISTER SUCT 1200ML W/VALVE (MISCELLANEOUS) ×1 IMPLANT
CHLORAPREP W/TINT 26 (MISCELLANEOUS) ×1 IMPLANT
CLIP APPLIE 9.375 MED OPEN (MISCELLANEOUS) ×1 IMPLANT
COVER BACK TABLE 60X90IN (DRAPES) ×1 IMPLANT
COVER MAYO STAND STRL (DRAPES) ×1 IMPLANT
COVER PROBE CYLINDRICAL 5X96 (MISCELLANEOUS) ×1 IMPLANT
DERMABOND ADVANCED .7 DNX12 (GAUZE/BANDAGES/DRESSINGS) ×1 IMPLANT
DRAPE LAPAROSCOPIC ABDOMINAL (DRAPES) ×1 IMPLANT
DRAPE UTILITY XL STRL (DRAPES) ×1 IMPLANT
ELECT COATED BLADE 2.86 ST (ELECTRODE) ×1 IMPLANT
ELECTRODE REM PT RTRN 9FT ADLT (ELECTROSURGICAL) ×1 IMPLANT
GLOVE BIOGEL PI IND STRL 8 (GLOVE) ×1 IMPLANT
GLOVE ECLIPSE 8.0 STRL XLNG CF (GLOVE) ×1 IMPLANT
GOWN STRL REUS W/ TWL LRG LVL3 (GOWN DISPOSABLE) ×2 IMPLANT
GOWN STRL REUS W/ TWL XL LVL3 (GOWN DISPOSABLE) ×1 IMPLANT
HEMOSTAT ARISTA ABSORB 3G PWDR (HEMOSTASIS) IMPLANT
HEMOSTAT SNOW SURGICEL 2X4 (HEMOSTASIS) IMPLANT
KIT MARKER MARGIN INK (KITS) ×1 IMPLANT
NDL HYPO 25X1 1.5 SAFETY (NEEDLE) ×1 IMPLANT
NDL SAFETY ECLIPSE 18X1.5 (NEEDLE) IMPLANT
NEEDLE HYPO 25X1 1.5 SAFETY (NEEDLE) ×1 IMPLANT
NS IRRIG 1000ML POUR BTL (IV SOLUTION) ×1 IMPLANT
PACK BASIN DAY SURGERY FS (CUSTOM PROCEDURE TRAY) ×1 IMPLANT
PENCIL SMOKE EVACUATOR (MISCELLANEOUS) ×1 IMPLANT
SLEEVE SCD COMPRESS KNEE MED (STOCKING) ×1 IMPLANT
SPIKE FLUID TRANSFER (MISCELLANEOUS) IMPLANT
SPONGE T-LAP 4X18 ~~LOC~~+RFID (SPONGE) ×1 IMPLANT
SUT MNCRL AB 4-0 PS2 18 (SUTURE) ×1 IMPLANT
SUT VICRYL 3-0 CR8 SH (SUTURE) ×1 IMPLANT
SYR CONTROL 10ML LL (SYRINGE) ×1 IMPLANT
TOWEL GREEN STERILE FF (TOWEL DISPOSABLE) ×1 IMPLANT
TRACER MAGTRACE VIAL (MISCELLANEOUS) IMPLANT
TRAY FAXITRON CT DISP (TRAY / TRAY PROCEDURE) ×1 IMPLANT
TUBE CONNECTING 20X1/4 (TUBING) ×1 IMPLANT
YANKAUER SUCT BULB TIP NO VENT (SUCTIONS) ×1 IMPLANT

## 2024-03-12 NOTE — H&P (Signed)
 Chief Complaint: Breast Cancer  History of Present Illness: Latoya Fernandez is a 53 y.o. female who is seen today as an office consultation for evaluation of Breast Cancer  Patient is seen today for evaluation of newly diagnosed left breast cancer after abnormal screening mammogram. Core biopsy of a density left breast upper outer quadrant showed a 6 mm area of calcifications core biopsy proven to be IDC grade 2 with DCIS and ADH. ER positive PR positive HER2/neu negative. She has no complaints  Review of Systems: A complete review of systems was obtained from the patient. I have reviewed this information and discussed as appropriate with the patient. See HPI as well for other ROS.    Medical History: Past Medical History:  Diagnosis Date  History of cancer   Patient Active Problem List  Diagnosis  Malignant neoplasm of upper-inner quadrant of left breast in female, estrogen receptor positive (CMS/HHS-HCC)   Past Surgical History:  Procedure Laterality Date  .Left Breast Biopsy Left 02/14/2024  CHOLECYSTECTOMY    No Known Allergies  Current Outpatient Medications on File Prior to Visit  Medication Sig Dispense Refill  cetirizine (ZYRTEC) 10 mg capsule Take 10 mg by mouth once daily  cholecalciferol (VITAMIN D3) 1000 unit capsule Take 1,000 Units by mouth once daily   No current facility-administered medications on file prior to visit.   Family History  Problem Relation Age of Onset  Obesity Sister    Social History   Tobacco Use  Smoking Status Never  Smokeless Tobacco Never    Social History   Socioeconomic History  Marital status: Unknown  Tobacco Use  Smoking status: Never  Smokeless tobacco: Never  Vaping Use  Vaping status: Never Used  Substance and Sexual Activity  Alcohol use: Yes  Drug use: Not Currently   Social Drivers of Health   Food Insecurity: No Food Insecurity (02/07/2024)  [facility]  Hunger Vital Sign  Worried About  Running Out of Food in the Last Year: Never true  Ran Out of Food in the Last Year: Never true  Transportation Needs: No Transportation Needs (02/07/2024)  Received from [facility] - Transportation  Lack of Transportation (Medical): No  Lack of Transportation (Non-Medical): No   Objective:  There were no vitals filed for this visit.  There is no height or weight on file to calculate BMI.  Physical Exam Exam conducted with a chaperone present.  Cardiovascular:  Rate and Rhythm: Normal rate.  Pulmonary:  Effort: Pulmonary effort is normal.  Chest:  Breasts: Right: Normal.  Left: Normal.  Musculoskeletal:  General: Normal range of motion.  Lymphadenopathy:  Upper Body:  Right upper body: No axillary adenopathy.  Left upper body: No axillary adenopathy.  Skin: General: Skin is warm.  Neurological:  General: No focal deficit present.  Mental Status: She is alert.  Psychiatric:  Mood and Affect: Mood normal.     Labs, Imaging and Diagnostic Testing: Left breast shows 6 mm cluster of microcalcifications core biopsy proven to be grade 2 IDC ER positive PR positive HER2/neu negative with DCIS and ADH.  Assessment and Plan:   Diagnoses and all orders for this visit:  Malignant neoplasm of upper-inner quadrant of left breast in female, estrogen receptor positive (CMS/HHS-HCC)   Patient seen in the [facility] this morning. She is opted for breast conserving surgery with left breast seed localized lumpectomy and left axillary sentinel lymph node mapping with mag trace. Risks and benefits reviewed. Risk of lymphedema reviewed.The procedure  has been discussed with the patient. Alternatives to surgery have been discussed with the patient. Risks of surgery include bleeding, Infection, Seroma formation, death, and the need for further surgery. The patient understands and wishes to proceed.    Sharlee Dawes, MD

## 2024-03-12 NOTE — Anesthesia Procedure Notes (Signed)
 Procedure Name: LMA Insertion Date/Time: 03/12/2024 1:19 PM  Performed by: Noralyn Beams, CRNAPre-anesthesia Checklist: Patient identified, Emergency Drugs available, Suction available and Patient being monitored Patient Re-evaluated:Patient Re-evaluated prior to induction Oxygen Delivery Method: Circle system utilized Preoxygenation: Pre-oxygenation with 100% oxygen Induction Type: IV induction Ventilation: Mask ventilation without difficulty LMA: LMA inserted LMA Size: 4.0 Number of attempts: 1 Airway Equipment and Method: Bite block Placement Confirmation: positive ETCO2 Tube secured with: Tape Dental Injury: Teeth and Oropharynx as per pre-operative assessment

## 2024-03-12 NOTE — Op Note (Signed)
 Preoperative diagnosis: Left breast cancer stage I ER positive upper inner quadrant  Postoperative diagnosis: Same  Procedure: Left breast seed localized lumpectomy utilizing bracketed approach with left axillary sentinel lymph node mapping using mag trace injection  Surgeon: Sim Dryer, MD   Assistant: Aurea Leek PA-C  Anesthesia: LMA with 0.25% Marcaine  with epinephrine  and left pectoral block per anesthesia  EBL: Minimal  Specimen: Left breast tissue with both seeds and both clips verified by Faxitron.  The tissue was oriented with ink and sent to pathology.  3 left axillary sentinel nodes that it taken up the tracer  Drains: None  Indications for procedure: The patient is a 53 year old female with stage I left breast cancer seen in the multidisciplinary clinic.  After reviewing her options she is opted for breast conserving surgery.The procedure has been discussed with the patient. Alternatives to surgery have been discussed with the patient.  Risks of surgery include bleeding,  Infection,  Seroma formation, death,  and the need for further surgery.   The patient understands and wishes to proceed. Sentinel lymph node mapping and dissection has been discussed with the patient.  Risk of bleeding,  Infection,  Seroma formation,  Additional procedures,,  Shoulder weakness ,  Shoulder stiffness, lymphedema nerve and blood vessel injury and reaction to the mapping dyes have been discussed.  Alternatives to surgery have been discussed with the patient.  The patient agrees to proceed.   Description of procedure: The patient was met in the holding area and questions were answered with the assistance of a translator.  Left breast was marked as correct site.  Of note she underwent seed placement with 2 seeds yesterday.  Pectoral block placed by anesthesia.  All questions were answered.  She was taken back to the operating room.  She was placed supine upon the operative table.  After induction of  general anesthesia, left breast was prepped and draped in sterile fashion timeout performed.  2 cc of mag trace were injected in the central breast and massaged for 5 minutes.  Neoprobe was then used to identify the seed left breast upper inner quadrant.  There were 2 seeds in the same quadrant.  A transverse incision was made over both areas.  Dissection was carried down all tissue around both seeds and both clips were excised with grossly negative margins.  Imaging revealed both seeds and both clips to be present.  Irrigation to the cavity was done.  This was clear.  Local anesthetic was infiltrated throughout.  Clips were placed.  The wound was closed with deep layer 3-0 Vicryl.  4 Monocryl used to close the skin in a subcuticular fashion.  Mag trace probe was used to identify increased uptake of signal in left axilla.  A transverse incision was made and dissection was carried down into the level 1 x-ray contents.  There were 3 nodes that had taken the tracer.  Background imaging with the probe did not reveal any significant other areas of uptake.  These were removed.  These were passed off the field and sent to pathology.  The cavities made hemostatic with cautery.  Arista placed.  Local anesthetic infiltrated.  The deep tissue planes were approximated 3-0 Vicryl.  4 Monocryl is used to close the skin in a subcuticular fashion.  Dermabond applied to both.  Breast binder placed.  All counts were found to be correct.  The patient was awoke extubated taken recovery in satisfactory condition.

## 2024-03-12 NOTE — Transfer of Care (Signed)
 Immediate Anesthesia Transfer of Care Note  Patient: Latoya Fernandez  Procedure(s) Performed: BREAST LUMPECTOMY WITH RADIOACTIVE SEED AND SENTINEL LYMPH NODE BIOPSY (Left: Breast)  Patient Location: PACU  Anesthesia Type:GA combined with regional for post-op pain  Level of Consciousness: drowsy  Airway & Oxygen Therapy: Patient Spontanous Breathing and Patient connected to face mask oxygen  Post-op Assessment: Report given to RN and Post -op Vital signs reviewed and stable  Post vital signs: Reviewed and stable  Last Vitals:  Vitals Value Taken Time  BP 133/74 03/12/24 1437  Temp    Pulse 89 03/12/24 1439  Resp 15 03/12/24 1439  SpO2 100 % 03/12/24 1439  Vitals shown include unfiled device data.  Last Pain:  Vitals:   03/12/24 1108  TempSrc: Temporal  PainSc: 0-No pain         Complications: No notable events documented.

## 2024-03-12 NOTE — Anesthesia Procedure Notes (Signed)
 Anesthesia Regional Block: Pectoralis block   Pre-Anesthetic Checklist: , timeout performed,  Correct Patient, Correct Site, Correct Laterality,  Correct Procedure, Correct Position, site marked,  Risks and benefits discussed,  Surgical consent,  Pre-op evaluation,  At surgeon's request and post-op pain management  Laterality: Left  Prep: chloraprep       Needles:  Injection technique: Single-shot  Needle Type: Echogenic Needle     Needle Length: 9cm  Needle Gauge: 21     Additional Needles:   Procedures:,,,, ultrasound used (permanent image in chart),,    Narrative:  Start time: 03/12/2024 11:23 AM End time: 03/12/2024 11:30 AM Injection made incrementally with aspirations every 5 mL.  Performed by: Personally  Anesthesiologist: Peggy Bowens, MD

## 2024-03-12 NOTE — Interval H&P Note (Signed)
 History and Physical Interval Note:  03/12/2024 12:26 PM  Latoya Fernandez  has presented today for surgery, with the diagnosis of Malignant neoplasm of upper-inner quadrant of left breast in female, estrogen receptor positive HCC C50.212, Z17.0.  The various methods of treatment have been discussed with the patient and family. After consideration of risks, benefits and other options for treatment, the patient has consented to  Procedure(s) with comments: BREAST LUMPECTOMY WITH RADIOACTIVE SEED AND SENTINEL LYMPH NODE BIOPSY (Left) - LEFT BREAST SEED LUMPECTOMY, LEFT SENTINEL LYMPH NODE MAPPING GEN w/ PEC BLOCK as a surgical intervention.  The patient's history has been reviewed, patient examined, no change in status, stable for surgery.  I have reviewed the patient's chart and labs.  Questions were answered to the patient's satisfaction.     Adrena Nakamura A Jimmye Wisnieski

## 2024-03-12 NOTE — Anesthesia Postprocedure Evaluation (Signed)
 Anesthesia Post Note  Patient: Psychiatric nurse  Procedure(s) Performed: BREAST LUMPECTOMY WITH RADIOACTIVE SEED AND SENTINEL LYMPH NODE BIOPSY (Left: Breast)     Patient location during evaluation: PACU Anesthesia Type: General Level of consciousness: awake and alert Pain management: pain level controlled Vital Signs Assessment: post-procedure vital signs reviewed and stable Respiratory status: spontaneous breathing, nonlabored ventilation, respiratory function stable and patient connected to nasal cannula oxygen Cardiovascular status: blood pressure returned to baseline and stable Postop Assessment: no apparent nausea or vomiting Anesthetic complications: no  No notable events documented.  Last Vitals:  Vitals:   03/12/24 1515 03/12/24 1520  BP:    Pulse: 90 83  Resp: 15 12  Temp:    SpO2: 100% 100%    Last Pain:  Vitals:   03/12/24 1543  TempSrc:   PainSc: 3                  Melvenia Stabs

## 2024-03-12 NOTE — Discharge Instructions (Addendum)
  Post Anesthesia Home Care Instructions  Activity: Get plenty of rest for the remainder of the day. A responsible individual must stay with you for 24 hours following the procedure.  For the next 24 hours, DO NOT: -Drive a car -Advertising copywriter -Drink alcoholic beverages -Take any medication unless instructed by your physician -Make any legal decisions or sign important papers.  Meals: Start with liquid foods such as gelatin or soup. Progress to regular foods as tolerated. Avoid greasy, spicy, heavy foods. If nausea and/or vomiting occur, drink only clear liquids until the nausea and/or vomiting subsides. Call your physician if vomiting continues.  Special Instructions/Symptoms: Your throat may feel dry or sore from the anesthesia or the breathing tube placed in your throat during surgery. If this causes discomfort, gargle with warm salt water. The discomfort should disappear within 24 hours.     Next dose of Tylenol  can be taken at 715pm tonight if needed.

## 2024-03-12 NOTE — Progress Notes (Signed)
Assisted Dr. Rodman Comp with left, pectoralis, ultrasound guided block. Side rails up, monitors on throughout procedure. See vital signs in flow sheet. Tolerated Procedure well. ?

## 2024-03-12 NOTE — Anesthesia Preprocedure Evaluation (Signed)
 Anesthesia Evaluation  Patient identified by MRN, date of birth, ID band Patient awake    Reviewed: Allergy & Precautions, NPO status , Patient's Chart, lab work & pertinent test results  Airway Mallampati: II  TM Distance: >3 FB Neck ROM: Full    Dental  (+) Dental Advisory Given   Pulmonary neg pulmonary ROS   breath sounds clear to auscultation       Cardiovascular negative cardio ROS  Rhythm:Regular Rate:Normal     Neuro/Psych negative neurological ROS     GI/Hepatic negative GI ROS, Neg liver ROS,,,  Endo/Other  negative endocrine ROS    Renal/GU negative Renal ROS     Musculoskeletal   Abdominal   Peds  Hematology negative hematology ROS (+)   Anesthesia Other Findings   Reproductive/Obstetrics                             Anesthesia Physical Anesthesia Plan  ASA: 2  Anesthesia Plan: General   Post-op Pain Management: Regional block*, Tylenol  PO (pre-op)* and Toradol IV (intra-op)*   Induction: Intravenous  PONV Risk Score and Plan: 3 and Ondansetron , Dexamethasone , Midazolam , Treatment may vary due to age or medical condition and Propofol  infusion  Airway Management Planned: LMA  Additional Equipment:   Intra-op Plan:   Post-operative Plan: Extubation in OR  Informed Consent: I have reviewed the patients History and Physical, chart, labs and discussed the procedure including the risks, benefits and alternatives for the proposed anesthesia with the patient or authorized representative who has indicated his/her understanding and acceptance.     Dental advisory given  Plan Discussed with: CRNA  Anesthesia Plan Comments:        Anesthesia Quick Evaluation

## 2024-03-13 ENCOUNTER — Encounter (HOSPITAL_BASED_OUTPATIENT_CLINIC_OR_DEPARTMENT_OTHER): Payer: Self-pay | Admitting: Surgery

## 2024-03-13 ENCOUNTER — Encounter: Payer: Self-pay | Admitting: *Deleted

## 2024-03-17 LAB — SURGICAL PATHOLOGY

## 2024-03-18 ENCOUNTER — Encounter: Payer: Self-pay | Admitting: *Deleted

## 2024-03-18 DIAGNOSIS — Z170 Estrogen receptor positive status [ER+]: Secondary | ICD-10-CM

## 2024-04-01 NOTE — Progress Notes (Signed)
 Location of Breast Cancer:Left   Histology per Pathology Report: ****C50.212   Receptor Status: ER(95%, positive), PR (100%, positive), Her2-neu (1+), Ki-67(1%)  Did patient present with symptoms (if so, please note symptoms) or was this found on screening mammography?:    Past/Anticipated interventions by surgeon, if any: 03/12/2024 Past/Anticipated interventions by medical oncology, if any:   Margert Sheerer, MD 02/20/24   Lymphedema issues, if any:  {:18581} {t:21944}   Pain issues, if any:  {:18581} {PAIN DESCRIPTION:[id number]}  SAFETY ISSUES: Prior radiation? {:18581} Pacemaker/ICD? {:18581} Possible current pregnancy?{:18581} Is the patient on methotrexate? {:18581}  Current Complaints / other details:  ***

## 2024-04-02 ENCOUNTER — Ambulatory Visit: Attending: Surgery | Admitting: Physical Therapy

## 2024-04-02 DIAGNOSIS — M79622 Pain in left upper arm: Secondary | ICD-10-CM

## 2024-04-02 DIAGNOSIS — Z483 Aftercare following surgery for neoplasm: Secondary | ICD-10-CM

## 2024-04-02 DIAGNOSIS — C50212 Malignant neoplasm of upper-inner quadrant of left female breast: Secondary | ICD-10-CM | POA: Insufficient documentation

## 2024-04-02 DIAGNOSIS — Z170 Estrogen receptor positive status [ER+]: Secondary | ICD-10-CM | POA: Insufficient documentation

## 2024-04-02 DIAGNOSIS — R600 Localized edema: Secondary | ICD-10-CM

## 2024-04-02 NOTE — Therapy (Signed)
 OUTPATIENT PHYSICAL THERAPY BREAST CANCER POST OP FOLLOW UP   Patient Name: Latoya Fernandez MRN: [id number] DOB:Aug 14, 1971, 53 y.o., female Today's Date: 04/02/2024  END OF SESSION:  PT End of Session - 04/02/24 1142     Visit Number 2    Number of Visits 10    Date for PT Re-Evaluation 04/30/24    PT Start Time 1100    PT Stop Time 1140    PT Time Calculation (min) 40 min    Activity Tolerance Patient tolerated treatment well    Behavior During Therapy [facility] for tasks assessed/performed             Past Medical History:  Diagnosis Date   Breast cancer [facility])    Past Surgical History:  Procedure Laterality Date   BREAST BIOPSY Left 02/14/2024   MM LT BREAST BX W LOC DEV 1ST LESION IMAGE BX SPEC STEREO GUIDE 02/14/2024 [facility]   BREAST BIOPSY Left 02/14/2024   MM LT BREAST BX W LOC DEV EA AD LESION IMG BX SPEC STEREO GUIDE 02/14/2024 [facility]   BREAST BIOPSY  03/11/2024   MM LT RADIOACTIVE SEED EA ADD LESION LOC MAMMO GUIDE 03/11/2024 [facility]   BREAST BIOPSY  03/11/2024   MM LT RADIOACTIVE SEED LOC MAMMO GUIDE 03/11/2024 [facility]   BREAST LUMPECTOMY WITH RADIOACTIVE SEED AND SENTINEL LYMPH NODE BIOPSY Left 03/12/2024   Procedure: BREAST LUMPECTOMY WITH RADIOACTIVE SEED AND SENTINEL LYMPH NODE BIOPSY;  Surgeon: Sim Dryer, MD;  Location: [facility];  Service: General;  Laterality: Left;  LEFT BREAST SEED LUMPECTOMY, LEFT SENTINEL LYMPH NODE MAPPING GEN w/ PEC BLOCK   CHOLECYSTECTOMY     Patient Active Problem List   Diagnosis Date Noted   Genetic testing 03/04/2024   Malignant neoplasm of upper-inner quadrant of left breast in female, estrogen receptor positive (HCC) 02/19/2024    PCP: none  REFERRING PROVIDER: Dr. Sim Dryer   REFERRING DIAG: Left breast cancer  THERAPY DIAG:  Pain in left upper arm  Aftercare following surgery for neoplasm  Localized edema  Rationale for Evaluation and Treatment:  Rehabilitation  ONSET DATE: 02/14/24  SUBJECTIVE:                                                                                                                                                                                           SUBJECTIVE STATEMENT: I am doing good. I am having some pain in my arm. The pain feels like burning and I have inflammation at my elbow.   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 It measures 6 mm and is located in the upper inner quadrant. It is ER/PR positive and HER2 negative with a Ki67 of 1%. 03/12/24- L lumpectomy and L SLNB 0/1  PATIENT GOALS:  Reassess how my recovery is going related to arm function, pain, and swelling.  PAIN:  Are you having pain? Yes: NPRS scale: 5 Pain location: L UE Pain description: burning, discomfort, intermittent Aggravating factors: touching it Relieving factors: not touching  PRECAUTIONS: Recent Surgery, left UE Lymphedema risk,   RED FLAGS: None   ACTIVITY LEVEL / LEISURE: has been doing post op exercises, walking every day for 20-30 minutes   OBJECTIVE:   PATIENT SURVEYS:  QUICK DASH:  Quick Dash - 04/02/24 0001     Open a tight or new jar Mild difficulty    Do heavy household chores (wash walls, wash floors) Moderate difficulty    Carry a shopping bag or briefcase Mild difficulty    Wash your back Mild difficulty    Use a knife to cut food Mild difficulty    Recreational activities in which you take some force or impact through your arm, shoulder, or hand (golf, hammering, tennis) Moderate difficulty    During the past week, to what extent has your arm, shoulder or hand problem interfered with your normal social activities with family, friends, neighbors, or groups? Slightly    During the past week, to what extent has your arm, shoulder or hand problem limited your work or other regular daily activities Quite a bit    Arm,  shoulder, or hand pain. Mild    Tingling (pins and needles) in your arm, shoulder, or hand Mild    Difficulty Sleeping Mild difficulty    DASH Score 34.09 %              OBSERVATIONS: Fullness just superior to medial antecubital fossa secondary to cording, cording palpable from L axilla to antecubital fossa  POSTURE:  Forward head and rounded shoulders posture   UPPER EXTREMITY AROM/PROM:   A/PROM RIGHT   eval    Shoulder extension 43  Shoulder flexion 147  Shoulder abduction 159  Shoulder internal rotation 76  Shoulder external rotation 76                          (Blank rows = not tested)   A/PROM LEFT   eval 04/02/24  Shoulder extension 40 65  Shoulder flexion 139 163  Shoulder abduction 156 161  Shoulder internal rotation 83 55  Shoulder external rotation 70 79                          (Blank rows = not tested)   CERVICAL AROM: All within normal limits   UPPER EXTREMITY STRENGTH: WNL   LYMPHEDEMA ASSESSMENTS (in cm):    LANDMARK RIGHT   eval  10 cm proximal to olecranon process 34.5  Olecranon process 27.1  10 cm proximal to ulnar styloid process 25.7  Just proximal to ulnar styloid process 16.8  Across hand at thumb web space 18.9  At base of 2nd digit 6.3  (Blank rows = not tested)   LANDMARK LEFT   eval 04/02/24  10 cm proximal to olecranon process 34.9 33  Olecranon process 27 27.3  10 cm proximal to ulnar styloid process 24.6 24  Just proximal to ulnar styloid process 17 16.8  Across hand at thumb web space 18.9 19.2  At base of 2nd digit 6 5.9  (Blank rows = not tested)  Surgery type/Date: 03/12/24 L lumpectomy and SLNB Number of lymph nodes removed: 0/1 Current/past treatment (chemo, radiation, hormone therapy): does not need chemo, will need radiation, might require hormone therapy Other symptoms:  Heaviness/tightness Yes Pain Yes Pitting edema No Infections No Decreased scar mobility Yes Stemmer sign No  PATIENT EDUCATION:   Education details: need for end range stretching to help stretching cording, cording Person educated: Patient Education method: Explanation Education comprehension: verbalized understanding  HOME EXERCISE PROGRAM: Reviewed previously given post op HEP.   ASSESSMENT:  CLINICAL IMPRESSION: Pt returns to PT after undergoing a L lumpectomy and SLNB on 03/12/24 with 0/1 nodes removed for treatment of L breast cancer. She will require radiation. She has returned to baseline L shoulder ROM but does have pain in L upper arm secondary to numerous cords extending from axilla to antecubital fossa with localized swelling noted in upper arm secondary to cording. She would benefit from skilled PT services to decrease cording, improve comfort and decrease swelling.   Pt will benefit from skilled therapeutic intervention to improve on the following deficits: Decreased knowledge of precautions, impaired UE functional use, pain, decreased ROM, postural dysfunction.   PT treatment/interventions: ADL/Self care home management, [phone]- PT Re-evaluation, 97110-Therapeutic exercises, 97530- Therapeutic activity, [id number]- Neuromuscular re-education, 97535- Self Care, [id number]- Manual therapy, [id number]- Orthotic Initial, and [id number]- Orthotic/Prosthetic subsequent   GOALS: Goals reviewed with patient? Yes  LONG TERM GOALS:  (STG=LTG)  GOALS Name Target Date  Goal status  1 Pt will demonstrate she has regained full shoulder ROM and function post operatively compared to baselines.  Baseline: 04/02/24  MET  2 Pt will report a 50% improvement in pain in LUE to allow improved comfort. 04/30/24 INITIAL  3 Pt will be independent in self MLD for long term management of lymphedema. 04/30/24 INITIAL  4 Pt will be independent in a home exercise program for continued stretching and strengthening.  04/30/24 INITIAL     PLAN:  PT FREQUENCY/DURATION: 2x/wk for 4 wks  PLAN FOR NEXT SESSION: pulleys, ball, MFR to cording in L axilla  and upper arm, MLD to L upper arm   [facility]  [address], [address]  [place] [address]  [phone]  After Breast Cancer Class Video It is recommended you view the ABC class video to be educated on lymphedema risk reduction. This video lasts for about 30 minutes. It can be viewed on our website here: [email or web address]  Scar massage You can begin gentle scar massage to you incision sites. Gently place one hand on the incision and move the skin (without sliding on the skin) in various directions. Do this for a few minutes and then you can gently massage either coconut oil or vitamin E cream into the scars.  Compression garment You should continue wearing your compression bra until you feel like you no longer have swelling.  Home exercise Program Continue doing the exercises you were given until you feel like you can do them without feeling any tightness at the end.   Walking Program Studies show that 30 minutes of walking per day (fast enough to elevate your heart rate) can significantly reduce the risk of a cancer recurrence. If you can't walk due to other medical reasons, we encourage you to find another activity you could do (like a stationary bike or water exercise).  Posture After breast cancer surgery, people frequently sit with rounded shoulders posture  because it puts their incisions on slack and feels better. If you sit like this and scar tissue forms in that position, you can become very tight and have pain sitting or standing with good posture. Try to be aware of your posture and sit and stand up tall to heal properly.  Follow up PT: It is recommended you return every 3 months for the first 3 years following surgery to be assessed on the SOZO machine for an L-Dex score. This helps prevent clinically significant lymphedema in 95% of patients. These follow up screens are 10 minute  appointments that you are not billed for.  [facility] [place], PT 04/02/2024, 11:48 AM

## 2024-04-03 ENCOUNTER — Encounter: Payer: Self-pay | Admitting: Radiology

## 2024-04-03 ENCOUNTER — Ambulatory Visit

## 2024-04-03 DIAGNOSIS — M79622 Pain in left upper arm: Secondary | ICD-10-CM

## 2024-04-03 DIAGNOSIS — R600 Localized edema: Secondary | ICD-10-CM

## 2024-04-03 DIAGNOSIS — Z483 Aftercare following surgery for neoplasm: Secondary | ICD-10-CM

## 2024-04-03 NOTE — Therapy (Signed)
 OUTPATIENT PHYSICAL THERAPY BREAST CANCER TREATMENT   Patient Name: Latoya Fernandez MRN: [id number] DOB:January 18, 1971, 53 y.o., female Today's Date: 04/03/2024  END OF SESSION:  PT End of Session - 04/03/24 1107     Visit Number 3    Number of Visits 10    Date for PT Re-Evaluation 04/30/24    PT Start Time 1105    PT Stop Time 1159    PT Time Calculation (min) 54 min    Activity Tolerance Patient tolerated treatment well    Behavior During Therapy [facility] for tasks assessed/performed             Past Medical History:  Diagnosis Date   Breast cancer [facility])    Past Surgical History:  Procedure Laterality Date   BREAST BIOPSY Left 02/14/2024   MM LT BREAST BX W LOC DEV 1ST LESION IMAGE BX SPEC STEREO GUIDE 02/14/2024 [facility]   BREAST BIOPSY Left 02/14/2024   MM LT BREAST BX W LOC DEV EA AD LESION IMG BX SPEC STEREO GUIDE 02/14/2024 [facility]   BREAST BIOPSY  03/11/2024   MM LT RADIOACTIVE SEED EA ADD LESION LOC MAMMO GUIDE 03/11/2024 [facility]   BREAST BIOPSY  03/11/2024   MM LT RADIOACTIVE SEED LOC MAMMO GUIDE 03/11/2024 [facility]   BREAST LUMPECTOMY WITH RADIOACTIVE SEED AND SENTINEL LYMPH NODE BIOPSY Left 03/12/2024   Procedure: BREAST LUMPECTOMY WITH RADIOACTIVE SEED AND SENTINEL LYMPH NODE BIOPSY;  Surgeon: Sim Dryer, MD;  Location: [facility];  Service: General;  Laterality: Left;  LEFT BREAST SEED LUMPECTOMY, LEFT SENTINEL LYMPH NODE MAPPING GEN w/ PEC BLOCK   CHOLECYSTECTOMY     Patient Active Problem List   Diagnosis Date Noted   Genetic testing 03/04/2024   Malignant neoplasm of upper-inner quadrant of left breast in female, estrogen receptor positive (HCC) 02/19/2024    PCP: none  REFERRING PROVIDER: Dr. Sim Dryer   REFERRING DIAG: Left breast cancer  THERAPY DIAG:  Pain in left upper arm  Aftercare following surgery for neoplasm  Localized edema  Rationale for Evaluation and Treatment:  Rehabilitation  ONSET DATE: 02/14/24  SUBJECTIVE:                                                                                                                                                                                           SUBJECTIVE STATEMENT: I noticed my arm was hurting a little more after yesterday but it's better today.   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 It measures 6 mm and is located in the upper  inner quadrant. It is ER/PR positive and HER2 negative with a Ki67 of 1%. 03/12/24- L lumpectomy and L SLNB 0/1  PATIENT GOALS:  Reassess how my recovery is going related to arm function, pain, and swelling.  PAIN:  Are you having pain? Yes: NPRS scale: 3/10 Pain location: L UE Pain description: burning, discomfort, intermittent Aggravating factors: touching it Relieving factors: not touching  PRECAUTIONS: Recent Surgery, left UE Lymphedema risk,   RED FLAGS: None   ACTIVITY LEVEL / LEISURE: has been doing post op exercises, walking every day for 20-30 minutes   OBJECTIVE:   PATIENT SURVEYS:  QUICK DASH:     OBSERVATIONS: Fullness just superior to medial antecubital fossa secondary to cording, cording palpable from L axilla to antecubital fossa  POSTURE:  Forward head and rounded shoulders posture   UPPER EXTREMITY AROM/PROM:   A/PROM RIGHT   eval    Shoulder extension 43  Shoulder flexion 147  Shoulder abduction 159  Shoulder internal rotation 76  Shoulder external rotation 76                          (Blank rows = not tested)   A/PROM LEFT   eval 04/02/24  Shoulder extension 40 65  Shoulder flexion 139 163  Shoulder abduction 156 161  Shoulder internal rotation 83 55  Shoulder external rotation 70 79                          (Blank rows = not tested)   CERVICAL AROM: All within normal limits   UPPER EXTREMITY STRENGTH: WNL   LYMPHEDEMA ASSESSMENTS  (in cm):    LANDMARK RIGHT   eval  10 cm proximal to olecranon process 34.5  Olecranon process 27.1  10 cm proximal to ulnar styloid process 25.7  Just proximal to ulnar styloid process 16.8  Across hand at thumb web space 18.9  At base of 2nd digit 6.3  (Blank rows = not tested)   LANDMARK LEFT   eval 04/02/24  10 cm proximal to olecranon process 34.9 33  Olecranon process 27 27.3  10 cm proximal to ulnar styloid process 24.6 24  Just proximal to ulnar styloid process 17 16.8  Across hand at thumb web space 18.9 19.2  At base of 2nd digit 6 5.9  (Blank rows = not tested)  Surgery type/Date: 03/12/24 L lumpectomy and SLNB Number of lymph nodes removed: 0/1 Current/past treatment (chemo, radiation, hormone therapy): does not need chemo, will need radiation, might require hormone therapy Other symptoms:  Heaviness/tightness Yes Pain Yes Pitting edema No Infections No Decreased scar mobility Yes Stemmer sign No   TODAY'S TREATMENT 04/03/24: Therapeutic Exercises Pulleys into flex and abd x 2 ins each with tactile and VC's throughout to decrease Lt scapular compensation Roll yellow ball up wall into flex x 10 and Lt UE abd x 5 returning therapist demo for each Manual Therapy P/ROM to Lt shoulder into flex, abd and D2 with scapular depression by therapist throughout; also briefly neural tension stretch  STM in supine to medial upper arm over area of palpable cording, this was some improved by end of session; then into Rt S/L for STM with cocoa butter to medial scap border and UT where pt palpably very tight Scap Mobs to Lt scapula into protraction and retraction when in Rt S/L   PATIENT EDUCATION:  Education details: need for end range stretching to help stretching  cording, cording Person educated: Patient Education method: Explanation Education comprehension: verbalized understanding  HOME EXERCISE PROGRAM: Reviewed previously given post op  HEP.   ASSESSMENT:  CLINICAL IMPRESSION: Pt reports feeling some better since eval yesterday. Began AA/ROM stretches which she did well with. Educated her during TE about scapular compensation and how she wants to work on decreasing this with her ADLs. Then during manual therapy, explained process of radiation simulation tomorrow and answered her questions about possible skin changes she could expect to see within this therapists scope of practice. Pts P/ROM and cording was improved by end of session and she reports feeling better.   Pt will benefit from skilled therapeutic intervention to improve on the following deficits: Decreased knowledge of precautions, impaired UE functional use, pain, decreased ROM, postural dysfunction.   PT treatment/interventions: ADL/Self care home management, [phone]- PT Re-evaluation, 97110-Therapeutic exercises, 97530- Therapeutic activity, [id number]- Neuromuscular re-education, 97535- Self Care, [id number]- Manual therapy, [id number]- Orthotic Initial, and [id number]- Orthotic/Prosthetic subsequent   GOALS: Goals reviewed with patient? Yes  LONG TERM GOALS:  (STG=LTG)  GOALS Name Target Date  Goal status  1 Pt will demonstrate she has regained full shoulder ROM and function post operatively compared to baselines.  Baseline: 04/02/24  MET  2 Pt will report a 50% improvement in pain in LUE to allow improved comfort. 04/30/24 INITIAL  3 Pt will be independent in self MLD for long term management of lymphedema. 04/30/24 INITIAL  4 Pt will be independent in a home exercise program for continued stretching and strengthening.  04/30/24 INITIAL     PLAN:  PT FREQUENCY/DURATION: 2x/wk for 4 wks  PLAN FOR NEXT SESSION: Cont pulleys, ball, MFR to cording in L axilla and upper arm, MLD to L upper arm; how was positioning for radiation sim?   [facility]  [address]  [place] [address]  [phone]     Denyce Flank,  PTA 04/03/2024, 12:29 PM

## 2024-04-04 ENCOUNTER — Ambulatory Visit
Admission: RE | Admit: 2024-04-04 | Discharge: 2024-04-04 | Disposition: A | Discharge status: Home / Self Care | Payer: Self-pay | Source: Ambulatory Visit | Attending: Radiation Oncology | Admitting: Radiation Oncology

## 2024-04-04 ENCOUNTER — Ambulatory Visit
Admission: RE | Admit: 2024-04-04 | Discharge: 2024-04-04 | Disposition: A | Discharge status: Home / Self Care | Payer: Self-pay | Source: Ambulatory Visit | Attending: Radiology | Admitting: Radiology

## 2024-04-04 VITALS — BP 150/73 | HR 85 | Temp 97.5°F | Resp 20 | Ht <= 58 in | Wt 193.8 lb

## 2024-04-04 DIAGNOSIS — C50212 Malignant neoplasm of upper-inner quadrant of left female breast: Secondary | ICD-10-CM

## 2024-04-04 DIAGNOSIS — Z853 Personal history of malignant neoplasm of breast: Secondary | ICD-10-CM | POA: Insufficient documentation

## 2024-04-04 DIAGNOSIS — M7989 Other specified soft tissue disorders: Secondary | ICD-10-CM | POA: Insufficient documentation

## 2024-04-04 DIAGNOSIS — Z170 Estrogen receptor positive status [ER+]: Secondary | ICD-10-CM | POA: Insufficient documentation

## 2024-04-04 DIAGNOSIS — Z510 Encounter for antineoplastic radiation therapy: Secondary | ICD-10-CM | POA: Insufficient documentation

## 2024-04-04 DIAGNOSIS — I890 Lymphedema, not elsewhere classified: Secondary | ICD-10-CM | POA: Insufficient documentation

## 2024-04-04 NOTE — Progress Notes (Signed)
 Radiation Oncology         [phone] ________________________________  Name: Latoya Fernandez        MRN: [id number]  Date of Service: 04/04/2024 DOB: Apr 20, 1971  [id number], No Pcp Per  Cameron Cea, MD     REFERRING PHYSICIAN: Cameron Cea, MD   DIAGNOSIS: The encounter diagnosis was Malignant neoplasm of upper-inner quadrant of left breast in female, estrogen receptor positive (HCC).   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 female last seen by us  in our multidisciplinary breast clinic on 02/20/2024.  Since then, patient proceeded with a left lumpectomy under the care of  Dr. Afton Horse on 03/12/2024.  Surgical pathology demonstrated intermediate grade invasive ductal carcinoma, measuring approximately 0.5 cm in greatest dimension.  All margins were negative for carcinoma, with the closest margin 0.3 cm medially.  1/1 left axillary lymph nodes were negative for carcinoma.  Surgical pathology significant for: Estrogen receptor 95% positive and progesterone receptor 100% positive, both with strong staining intensity; HER2 negative; Ki-67 1%.  Patient developed lymphedema in her left upper extremity postoperatively.  She was subsequently referred to physical therapy for this issue.  She was last seen by PT on 04/03/2024 and will continue to follow with  them for her lymphedema.  Patient reports to be doing well overall. She notes continued swelling in her left upper extremity since the surgery. She is able to raise her left arm above her head; however, this is difficult for her due to pain. She notes some "twinges" to the breast, but denies any pain or swelling to the breast.   PREVIOUS RADIATION THERAPY: No   PAST MEDICAL HISTORY:  Past Medical History:  Diagnosis Date   Breast cancer (HCC)        PAST SURGICAL HISTORY: Past Surgical History:  Procedure Laterality Date   BREAST BIOPSY Left 02/14/2024   MM LT BREAST BX W LOC DEV 1ST LESION IMAGE BX SPEC STEREO GUIDE 02/14/2024 [facility]   BREAST BIOPSY Left 02/14/2024   MM LT BREAST BX W LOC DEV EA AD LESION IMG BX SPEC STEREO GUIDE 02/14/2024 [facility]   BREAST BIOPSY  03/11/2024   MM LT RADIOACTIVE SEED EA ADD LESION LOC MAMMO GUIDE 03/11/2024 [facility]   BREAST BIOPSY  03/11/2024   MM LT RADIOACTIVE SEED LOC MAMMO GUIDE 03/11/2024 [facility]   BREAST LUMPECTOMY WITH RADIOACTIVE SEED AND SENTINEL LYMPH NODE BIOPSY Left 03/12/2024   Procedure: BREAST LUMPECTOMY WITH RADIOACTIVE SEED AND SENTINEL LYMPH NODE BIOPSY;  Surgeon: Sim Dryer, MD;  Location: [facility];  Service: General;  Laterality: Left;  LEFT BREAST SEED LUMPECTOMY, LEFT SENTINEL LYMPH NODE MAPPING GEN w/ PEC BLOCK   CHOLECYSTECTOMY       FAMILY HISTORY:  Family History  Problem Relation Age of Onset   Breast cancer Neg Hx      SOCIAL HISTORY:  reports  that she has never smoked. She has never used smokeless tobacco. She reports current alcohol use. She reports that she does not use drugs.   ALLERGIES: Patient has no known allergies.   MEDICATIONS:  Current Outpatient Medications  Medication Sig Dispense Refill   ibuprofen  (ADVIL ) 800 MG tablet Take 1 tablet (800 mg total) by mouth every 8 (eight) hours as needed. 30 tablet 0   montelukast (SINGULAIR) 10 MG  tablet Take 10 mg by mouth at bedtime.     oxyCODONE  (OXY IR/ROXICODONE ) 5 MG immediate release tablet Take 1 tablet (5 mg total) by mouth every 6 (six) hours as needed for severe pain (pain score 7-10). 15 tablet 0   No current facility-administered medications for this encounter.     REVIEW OF SYSTEMS: Notable for that above.      PHYSICAL EXAM:  Wt Readings from Last 3 Encounters:  04/04/24 193 lb 12.8 oz (87.9 kg)  03/12/24 189 lb 9.5 oz (86 kg)  02/20/24 189 lb 4.8 oz (85.9 kg)   Temp Readings from Last 3 Encounters:  04/04/24 (!) 97.5 F (36.4 C)  03/12/24 97.9 F (36.6 C) (Temporal)  02/20/24 98.2 F (36.8 C) (Temporal)   BP Readings from Last 3 Encounters:  04/04/24 (!) 150/73  03/12/24 (!) 140/72  02/20/24 (!) 140/68   Pulse Readings from Last 3 Encounters:  04/04/24 85  03/12/24 97  02/20/24 73   Pain Assessment Pain Score: 0-No pain/10  In general this is a well appearing female in no acute distress. She's alert and oriented x4 and appropriate throughout the examination. Cardiopulmonary assessment is negative for acute distress and she exhibits normal effort.     ECOG = 0  0 - Asymptomatic (Fully active, able to carry on all predisease activities without restriction)  1 - Symptomatic but completely ambulatory (Restricted in physically strenuous activity but ambulatory and able to carry out work of a light or sedentary nature. For example, light housework, office work)  2 - Symptomatic, <50% in bed during the day (Ambulatory and capable of all self care but unable to carry out any work activities. Up and about more than 50% of waking hours)  3 - Symptomatic, >50% in bed, but not bedbound (Capable of only limited self-care, confined to bed or chair 50% or more of waking hours)  4 - Bedbound (Completely disabled. Cannot carry on any self-care. Totally confined to bed or chair)  5 - Death   Aurea Blossom MM, Creech RH, Tormey DC, et al. [phone]). "Toxicity and  response criteria of the [facility]". Am. Hillard Lowes. Oncol. 5 (6): 649-55    LABORATORY DATA:  Lab Results  Component Value Date   WBC 4.3 02/20/2024   HGB 14.9 02/20/2024   HCT 44.8 02/20/2024   MCV 89.1 02/20/2024   PLT 322 02/20/2024   Lab Results  Component Value Date   NA 141 02/20/2024   K 4.0 02/20/2024   CL 106 02/20/2024   CO2 30 02/20/2024   Lab Results  Component Value Date   ALT 31 02/20/2024   AST 23 02/20/2024   ALKPHOS 79 02/20/2024   BILITOT 0.5 02/20/2024      RADIOGRAPHY: MM Breast Surgical Specimen Result Date: 03/12/2024 CLINICAL DATA:  Post left lumpectomy. EXAM: SPECIMEN RADIOGRAPH OF THE LEFT BREAST COMPARISON:  Previous exam(s). FINDINGS: Status post excision of the left breast. The two radioactive seeds and both ribbon and X biopsy marker clips are present, completely intact, and were marked for  pathology. IMPRESSION: Specimen radiograph of the left breast. Electronically Signed   By: Alger Infield M.D.   On: 03/12/2024 13:56   MM LT RADIOACTIVE SEED LOC MAMMO GUIDE Result Date: 03/11/2024 CLINICAL DATA:  53 year old with biopsy-proven grade 2 invasive ductal carcinoma and intermediate grade DCIS and a separate focus of atypical ductal hyperplasia involving the UPPER INNER QUADRANT of the LEFT breast. Radioactive seed localization of both sites is performed in anticipation of lumpectomy which is scheduled for tomorrow. EXAM: MAMMOGRAPHIC GUIDED RADIOACTIVE SEED LOCALIZATION OF THE LEFT BREAST x 2 COMPARISON:  Previous exam(s). FINDINGS: Patient presents for radioactive seed localization prior to LEFT breast lumpectomy. I met with the patient and we discussed the procedure of seed localization including benefits and alternatives. We discussed the high likelihood of a successful procedure. We discussed the risks of the procedure including infection, bleeding, tissue injury and further surgery. We discussed the low dose of radioactivity  involved in the procedure. Informed, written consent was given. The usual time-out protocol was performed immediately prior to the procedure. Using mammographic guidance, sterile technique with chlorhexidine  as skin antisepsis, 1% lidocaine  as local anesthesia, 2 I-125 radioactive seeds were used to localize the X shaped tissue marking clip associated with IDC/DCIS and the ribbon shaped tissue marking clip associated with ADH using a medial approach. The follow-up mammogram images confirm that both seeds are appropriately positioned adjacent to the clips. The images are marked for Dr. Afton Horse. Follow-up survey of the patient confirms the presence of the radioactive seeds. The following information is the same for both of the seeds: Order number of I-125 seed: [id number] Total activity:  0.236 mCi Reference Date: 02/07/2024 The patient tolerated the procedure well without apparent immediate complications. She was released from the Breast Center with instructions regarding seed removal. IMPRESSION: Radioactive seed localization of biopsy-proven IDC/DCIS and a separate focus of ADH involving the UPPER INNER QUADRANT of the LEFT breast. Electronically Signed   By: Rinda Cheers M.D.   On: 03/11/2024 09:16   MM LT RAD SEED EA ADD LESION LOC MAMMO Result Date: 03/11/2024 CLINICAL DATA:  53 year old with biopsy-proven grade 2 invasive ductal carcinoma and intermediate grade DCIS and a separate focus of atypical ductal hyperplasia involving the UPPER INNER QUADRANT of the LEFT breast. Radioactive seed localization of both sites is performed in anticipation of lumpectomy which is scheduled for tomorrow. EXAM: MAMMOGRAPHIC GUIDED RADIOACTIVE SEED LOCALIZATION OF THE LEFT BREAST x 2 COMPARISON:  Previous exam(s). FINDINGS: Patient presents for radioactive seed localization prior to LEFT breast lumpectomy. I met with the patient and we discussed the procedure of seed localization including benefits and alternatives. We  discussed the high likelihood of a successful procedure. We discussed the risks of the procedure including infection, bleeding, tissue injury and further surgery. We discussed the low dose of radioactivity involved in the procedure. Informed, written consent was given. The usual time-out protocol was performed immediately prior to the procedure. Using mammographic guidance, sterile technique with chlorhexidine  as skin antisepsis, 1% lidocaine  as local anesthesia, 2 I-125 radioactive seeds were used to localize the X shaped tissue marking clip associated with IDC/DCIS and the ribbon shaped tissue marking clip associated with ADH using a medial approach. The follow-up mammogram images confirm that both seeds are appropriately positioned adjacent to the clips. The images are marked for Dr. Afton Horse. Follow-up survey of the patient confirms the presence of the radioactive seeds. The following information is the same for both of the seeds: Order number of I-125 seed: [id number]  Total activity:  0.236 mCi Reference Date: 02/07/2024 The patient tolerated the procedure well without apparent immediate complications. She was released from the Breast Center with instructions regarding seed removal. IMPRESSION: Radioactive seed localization of biopsy-proven IDC/DCIS and a separate focus of ADH involving the UPPER INNER QUADRANT of the LEFT breast. Electronically Signed   By: Rinda Cheers M.D.   On: 03/11/2024 09:16       IMPRESSION/PLAN: 1. 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 Dr. Jeryl Moris recommends radiation to reduce the risk of locoregional disease recurrence. Today we discussed the natural course of early stage breast cancer. We highlighted the role of radiotherapy in the management and focused on the details of logistics and delivery. We reviewed the anticipated acute and late  sequelae associated with radiation in this setting. The patient was encouraged to ask questions that I answered to the best of my ability. Patient expressed readiness to proceed with treatment. A consent form was reviewed and signed today.    Plan:  Patient is scheduled for CT simulation later today. Will plan to begin radiation approximately one week after simulation. Anticipate a conventional 4 week course of radiation to the left breast. We look forward to participating in this patient's care.    In a visit lasting 30 minutes, greater than 50% of the time was spent face to face discussing the patient's condition, in preparation for the discussion, and coordinating the patient's care.   The above documentation reflects my direct findings during this shared patient visit. Please see the separate note by Dr. Jeryl Moris on this date for the remainder of the patient's plan of care.    Amiel Kalata, PA-C   **Disclaimer: This note was dictated with voice recognition software. Similar sounding words can inadvertently be transcribed and this note may contain transcription errors which may not have been corrected upon publication of note.**

## 2024-04-07 ENCOUNTER — Ambulatory Visit

## 2024-04-07 DIAGNOSIS — Z483 Aftercare following surgery for neoplasm: Secondary | ICD-10-CM

## 2024-04-07 DIAGNOSIS — M79622 Pain in left upper arm: Secondary | ICD-10-CM

## 2024-04-07 DIAGNOSIS — R600 Localized edema: Secondary | ICD-10-CM

## 2024-04-07 NOTE — Therapy (Signed)
 OUTPATIENT PHYSICAL THERAPY BREAST CANCER TREATMENT   Patient Name: Latoya Fernandez MRN: [id number] DOB:09-25-1971, 53 y.o., female Today's Date: 04/07/2024  END OF SESSION:  PT End of Session - 04/07/24 0909     Visit Number 4    Number of Visits 10    Date for PT Re-Evaluation 04/30/24    PT Start Time 0905    PT Stop Time 1000    PT Time Calculation (min) 55 min    Activity Tolerance Patient tolerated treatment well    Behavior During Therapy [facility] for tasks assessed/performed             Past Medical History:  Diagnosis Date   Breast cancer [facility])    Past Surgical History:  Procedure Laterality Date   BREAST BIOPSY Left 02/14/2024   MM LT BREAST BX W LOC DEV 1ST LESION IMAGE BX SPEC STEREO GUIDE 02/14/2024 [facility]   BREAST BIOPSY Left 02/14/2024   MM LT BREAST BX W LOC DEV EA AD LESION IMG BX SPEC STEREO GUIDE 02/14/2024 [facility]   BREAST BIOPSY  03/11/2024   MM LT RADIOACTIVE SEED EA ADD LESION LOC MAMMO GUIDE 03/11/2024 [facility]   BREAST BIOPSY  03/11/2024   MM LT RADIOACTIVE SEED LOC MAMMO GUIDE 03/11/2024 [facility]   BREAST LUMPECTOMY WITH RADIOACTIVE SEED AND SENTINEL LYMPH NODE BIOPSY Left 03/12/2024   Procedure: BREAST LUMPECTOMY WITH RADIOACTIVE SEED AND SENTINEL LYMPH NODE BIOPSY;  Surgeon: Sim Dryer, MD;  Location: [facility];  Service: General;  Laterality: Left;  LEFT BREAST SEED LUMPECTOMY, LEFT SENTINEL LYMPH NODE MAPPING GEN w/ PEC BLOCK   CHOLECYSTECTOMY     Patient Active Problem List   Diagnosis Date Noted   Genetic testing 03/04/2024   Malignant neoplasm of upper-inner quadrant of left breast in female, estrogen receptor positive (HCC) 02/19/2024    PCP: none  REFERRING PROVIDER: Dr. Sim Dryer   REFERRING DIAG: Left breast cancer  THERAPY DIAG:  Pain in left upper arm  Aftercare following surgery for neoplasm  Localized edema  Rationale for Evaluation and Treatment:  Rehabilitation  ONSET DATE: 02/14/24  SUBJECTIVE:                                                                                                                                                                                           SUBJECTIVE STATEMENT: The radiation simulation went well. I didn't have a problem getting my arm into position. I was just a little sore after last visit but not bad. I got a ball to stretch with at home like we did here so I can get better sooner.  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 It measures 6 mm and is located in the upper inner quadrant. It is ER/PR positive and HER2 negative with a Ki67 of 1%. 03/12/24- L lumpectomy and L SLNB 0/1  PATIENT GOALS:  Reassess how my recovery is going related to arm function, pain, and swelling.  PAIN:  Are you having pain? No  PRECAUTIONS: Recent Surgery, left UE Lymphedema risk,   RED FLAGS: None   ACTIVITY LEVEL / LEISURE: has been doing post op exercises, walking every day for 20-30 minutes   OBJECTIVE:   PATIENT SURVEYS:  QUICK DASH:     OBSERVATIONS: Fullness just superior to medial antecubital fossa secondary to cording, cording palpable from L axilla to antecubital fossa  POSTURE:  Forward head and rounded shoulders posture   UPPER EXTREMITY AROM/PROM:   A/PROM RIGHT   eval    Shoulder extension 43  Shoulder flexion 147  Shoulder abduction 159  Shoulder internal rotation 76  Shoulder external rotation 76                          (Blank rows = not tested)   A/PROM LEFT   eval 04/02/24  Shoulder extension 40 65  Shoulder flexion 139 163  Shoulder abduction 156 161  Shoulder internal rotation 83 55  Shoulder external rotation 70 79                          (Blank rows = not tested)   CERVICAL AROM: All within normal limits   UPPER EXTREMITY STRENGTH: WNL   LYMPHEDEMA ASSESSMENTS (in cm):     LANDMARK RIGHT   eval  10 cm proximal to olecranon process 34.5  Olecranon process 27.1  10 cm proximal to ulnar styloid process 25.7  Just proximal to ulnar styloid process 16.8  Across hand at thumb web space 18.9  At base of 2nd digit 6.3  (Blank rows = not tested)   LANDMARK LEFT   eval 04/02/24  10 cm proximal to olecranon process 34.9 33  Olecranon process 27 27.3  10 cm proximal to ulnar styloid process 24.6 24  Just proximal to ulnar styloid process 17 16.8  Across hand at thumb web space 18.9 19.2  At base of 2nd digit 6 5.9  (Blank rows = not tested)  Surgery type/Date: 03/12/24 L lumpectomy and SLNB Number of lymph nodes removed: 0/1 Current/past treatment (chemo, radiation, hormone therapy): does not need chemo, will need radiation, might require hormone therapy Other symptoms:  Heaviness/tightness Yes Pain Yes Pitting edema No Infections No Decreased scar mobility Yes Stemmer sign No   TODAY'S TREATMENT 04/07/24: Therapeutic Exercises Pulleys into flex and abd x 2 mins each with tactile and VC's to remind of decreasing Lt scapular compensation Roll yellow ball up wall into flex and Lt UE abd with 1# added to Lt wrist Therapeutic Activities Supine over half foam roll for following to help improve increased ROM and posture: Bil UE horz abd x 10, bil UE scaption into a "V" x 10, then bil UE abd in a "snow angel" x 10 with 5 sec holds returning therapist demo for each. Spent time explaining to pt and daughter how supine allows for increased stretch, more so on bolster Manual Therapy P/ROM to Lt shoulder into flex, abd and D2 with scapular depression by therapist throughout; near  full P/ROM by end of session and pt reports feeling less tightness STM in supine to medial upper arm, cording not palpable today and pt much less tender to touch; then into Rt S/L for STM with cocoa butter to medial scap border and UT where pt palpably very tight still and  tender  04/03/24: Therapeutic Exercises Pulleys into flex and abd x 2 ins each with tactile and VC's throughout to decrease Lt scapular compensation Roll yellow ball up wall into flex x 10 and Lt UE abd x 5 returning therapist demo for each Manual Therapy P/ROM to Lt shoulder into flex, abd and D2 with scapular depression by therapist throughout; also briefly neural tension stretch  STM in supine to medial upper arm over area of palpable cording, this was some improved by end of session; then into Rt S/L for STM with cocoa butter to medial scap border and UT where pt palpably very tight Scap Mobs to Lt scapula into protraction and retraction when in Rt S/L   PATIENT EDUCATION:  Education details: need for end range stretching to help stretching cording, cording Person educated: Patient Education method: Explanation Education comprehension: verbalized understanding  HOME EXERCISE PROGRAM: Reviewed previously given post op HEP.   ASSESSMENT:  CLINICAL IMPRESSION: Pt already noting improved A/ROM and less tightness felt with OH reach. During MT, with interpreter, answered pts and daughters questions about lymphedema, issued ABC link handout in english and spanish, and discussed importance of compression bras during radiation. Pt has sports bras, issued info for [employer] to see if they can afford compression bras there as she does not have insurance.   Pt will benefit from skilled therapeutic intervention to improve on the following deficits: Decreased knowledge of precautions, impaired UE functional use, pain, decreased ROM, postural dysfunction.   PT treatment/interventions: ADL/Self care home management, [phone]- PT Re-evaluation, 97110-Therapeutic exercises, 97530- Therapeutic activity, [id number]- Neuromuscular re-education, 97535- Self Care, 98119- Manual therapy, [id number]- Orthotic Initial, and [id number]- Orthotic/Prosthetic subsequent   GOALS: Goals reviewed with patient? Yes  LONG  TERM GOALS:  (STG=LTG)  GOALS Name Target Date  Goal status  1 Pt will demonstrate she has regained full shoulder ROM and function post operatively compared to baselines.  Baseline: 04/02/24  MET  2 Pt will report a 50% improvement in pain in LUE to allow improved comfort. 04/30/24 INITIAL  3 Pt will be independent in self MLD for long term management of lymphedema. 04/30/24 INITIAL  4 Pt will be independent in a home exercise program for continued stretching and strengthening.  04/30/24 INITIAL     PLAN:  PT FREQUENCY/DURATION: 2x/wk for 4 wks  PLAN FOR NEXT SESSION: Cont pulleys, ball, MFR to cording in L axilla and upper arm, MLD to L upper arm; able to get compression bra? Assess her sports bras when she brings them   [facility]  [address], [address]  [place] [address]  [phone]     Denyce Flank, PTA 04/07/2024, 10:00 AM

## 2024-04-09 ENCOUNTER — Ambulatory Visit

## 2024-04-09 DIAGNOSIS — M79622 Pain in left upper arm: Secondary | ICD-10-CM

## 2024-04-09 DIAGNOSIS — Z483 Aftercare following surgery for neoplasm: Secondary | ICD-10-CM

## 2024-04-09 DIAGNOSIS — R600 Localized edema: Secondary | ICD-10-CM

## 2024-04-09 NOTE — Therapy (Signed)
 OUTPATIENT PHYSICAL THERAPY BREAST CANCER TREATMENT   Patient Name: Latoya Fernandez MRN: [id number] DOB:[birth year], 53 y.o., female Today's Date: 04/09/2024  END OF SESSION:  PT End of Session - 04/09/24 0909     Visit Number 5    Number of Visits 10    Date for PT Re-Evaluation 04/30/24    PT Start Time 0904    PT Stop Time 1001    PT Time Calculation (min) 57 min    Activity Tolerance Patient tolerated treatment well    Behavior During Therapy [facility] for tasks assessed/performed             Past Medical History:  Diagnosis Date   Breast cancer Latoya [place])    Past Surgical History:  Procedure Laterality Date   BREAST BIOPSY Left 02/14/2024   MM LT BREAST BX W LOC DEV 1ST LESION IMAGE BX SPEC STEREO GUIDE 02/14/2024 [facility]   BREAST BIOPSY Left 02/14/2024   MM LT BREAST BX W LOC DEV EA AD LESION IMG BX SPEC STEREO GUIDE 02/14/2024 [facility]   BREAST BIOPSY  03/11/2024   MM LT RADIOACTIVE SEED EA ADD LESION LOC MAMMO GUIDE 03/11/2024 [facility]   BREAST BIOPSY  03/11/2024   MM LT RADIOACTIVE SEED LOC MAMMO GUIDE 03/11/2024 [facility]   BREAST LUMPECTOMY WITH RADIOACTIVE SEED AND SENTINEL LYMPH NODE BIOPSY Left 03/12/2024   Procedure: BREAST LUMPECTOMY WITH RADIOACTIVE SEED AND SENTINEL LYMPH NODE BIOPSY;  Surgeon: Sim Dryer, MD;  Location: [facility];  Service: General;  Laterality: Left;  LEFT BREAST SEED LUMPECTOMY, LEFT SENTINEL LYMPH NODE MAPPING GEN w/ PEC BLOCK   CHOLECYSTECTOMY     Patient Active Problem List   Diagnosis Date Noted   Genetic testing 03/04/2024   Malignant neoplasm of upper-inner quadrant of left breast in female, estrogen receptor positive (HCC) 02/19/2024    PCP: none  REFERRING PROVIDER: Dr. Sim Dryer   REFERRING DIAG: Left breast cancer  THERAPY DIAG:  Pain in left upper arm  Aftercare following surgery for neoplasm  Localized edema  Rationale for Evaluation and Treatment:  Rehabilitation  ONSET DATE: 02/14/24  SUBJECTIVE:                                                                                                                                                                                           SUBJECTIVE STATEMENT: I felt fine after last session. I can tell my Lt arm is getting better.   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 It measures 6 mm and is located in the upper  inner quadrant. It is ER/PR positive and HER2 negative with a Ki67 of 1%. 03/12/24- L lumpectomy and L SLNB 0/1  PATIENT GOALS:  Reassess how my recovery is going related to arm function, pain, and swelling.  PAIN:  Are you having pain? No, just discomfort in my Lt axilla  PRECAUTIONS: Recent Surgery, left UE Lymphedema risk,   RED FLAGS: None   ACTIVITY LEVEL / LEISURE: has been doing post op exercises, walking every day for 20-30 minutes   OBJECTIVE:   PATIENT SURVEYS:  QUICK DASH:     OBSERVATIONS: Fullness just superior to medial antecubital fossa secondary to cording, cording palpable from L axilla to antecubital fossa  POSTURE:  Forward head and rounded shoulders posture   UPPER EXTREMITY AROM/PROM:   A/PROM RIGHT   eval    Shoulder extension 43  Shoulder flexion 147  Shoulder abduction 159  Shoulder internal rotation 76  Shoulder external rotation 76                          (Blank rows = not tested)   A/PROM LEFT   eval 04/02/24  Shoulder extension 40 65  Shoulder flexion 139 163  Shoulder abduction 156 161  Shoulder internal rotation 83 55  Shoulder external rotation 70 79                          (Blank rows = not tested)   CERVICAL AROM: All within normal limits   UPPER EXTREMITY STRENGTH: WNL   LYMPHEDEMA ASSESSMENTS (in cm):    LANDMARK RIGHT   eval  10 cm proximal to olecranon process 34.5  Olecranon process 27.1  10 cm proximal to ulnar  styloid process 25.7  Just proximal to ulnar styloid process 16.8  Across hand at thumb web space 18.9  At base of 2nd digit 6.3  (Blank rows = not tested)   LANDMARK LEFT   eval 04/02/24  10 cm proximal to olecranon process 34.9 33  Olecranon process 27 27.3  10 cm proximal to ulnar styloid process 24.6 24  Just proximal to ulnar styloid process 17 16.8  Across hand at thumb web space 18.9 19.2  At base of 2nd digit 6 5.9  (Blank rows = not tested)  Surgery type/Date: 03/12/24 L lumpectomy and SLNB Number of lymph nodes removed: 0/1 Current/past treatment (chemo, radiation, hormone therapy): does not need chemo, will need radiation, might require hormone therapy Other symptoms:  Heaviness/tightness Yes Pain Yes Pitting edema No Infections No Decreased scar mobility Yes Stemmer sign No   TODAY'S TREATMENT 04/09/24: Therapeutic Exercises Pulleys into flex and abd x 2 mins each with tactile and VC's to remind of decreasing Lt scapular compensation Roll yellow ball up wall into flex and Lt UE abd with 1# added to Lt wrist Therapeutic Activities Bil UE 3 way raises standing with back against wall and core engaged, and shoulders and head against wall x 10 each with 1# Supine over half foam roll for following to help improve increased ROM and posture: Bil UE horz abd x 10, bil UE scaption into a "V" x 10, then bil UE abd in a "snow angel" x 10 with 5 sec holds returning therapist demo for each. Manual Therapy P/ROM to Lt shoulder into flex, abd and D2 with scapular depression by therapist throughout; near full P/ROM by end of session and pt reports feeling less tightness STM in supine to  medial upper arm, cording not palpable today and pt much less tender to touch; then into Rt S/L for STM with cocoa butter to medial scap border and UT where pt palpably tight and tender, but less so for both today  04/07/24: Therapeutic Exercises Pulleys into flex and abd x 2 mins each with tactile  and VC's to remind of decreasing Lt scapular compensation Roll yellow ball up wall into flex and Lt UE abd with 1# added to Lt wrist Therapeutic Activities Supine over half foam roll for following to help improve increased ROM and posture: Bil UE horz abd x 10, bil UE scaption into a "V" x 10, then bil UE abd in a "snow angel" x 10 with 5 sec holds returning therapist demo for each. Spent time explaining to pt and daughter how supine allows for increased stretch, more so on bolster Manual Therapy P/ROM to Lt shoulder into flex, abd and D2 with scapular depression by therapist throughout; near full P/ROM by end of session and pt reports feeling less tightness STM in supine to medial upper arm, cording not palpable today and pt much less tender to touch; then into Rt S/L for STM with cocoa butter to medial scap border and UT where pt palpably very tight still and tender  04/03/24: Therapeutic Exercises Pulleys into flex and abd x 2 ins each with tactile and VC's throughout to decrease Lt scapular compensation Roll yellow ball up wall into flex x 10 and Lt UE abd x 5 returning therapist demo for each Manual Therapy P/ROM to Lt shoulder into flex, abd and D2 with scapular depression by therapist throughout; also briefly neural tension stretch  STM in supine to medial upper arm over area of palpable cording, this was some improved by end of session; then into Rt S/L for STM with cocoa butter to medial scap border and UT where pt palpably very tight Scap Mobs to Lt scapula into protraction and retraction when in Rt S/L   PATIENT EDUCATION:  Education details: need for end range stretching to help stretching cording, cording Person educated: Patient Education method: Explanation Education comprehension: verbalized understanding  HOME EXERCISE PROGRAM: Reviewed previously given post op HEP.   ASSESSMENT:  CLINICAL IMPRESSION: Pt is looking for other compression bras. Daughter didn't get to call  [employer] [facility] yet but will soon to see if she can afford one there without insurance. Continued with AA/ROM stretches and postural stretches over foam roll. Then progressed pt to include bil UE 3 way raises Then also continued with manual therapy. Good improvements noted with less tenderness and tightness at Lt axilla today. Issued phone number for [facility] [employer] [facility] and interpreter said she will help pt call to make an appt before she leaves today. Assessed her current sports bra and it does not cover lateral trunk or back well. Pt made aware.   Pt will benefit from skilled therapeutic intervention to improve on the following deficits: Decreased knowledge of precautions, impaired UE functional use, pain, decreased ROM, postural dysfunction.   PT treatment/interventions: ADL/Self care home management, [phone]- PT Re-evaluation, 97110-Therapeutic exercises, 97530- Therapeutic activity, [id number]- Neuromuscular re-education, 97535- Self Care, 13086- Manual therapy, [id number]- Orthotic Initial, and [id number]- Orthotic/Prosthetic subsequent   GOALS: Goals reviewed with patient? Yes  LONG TERM GOALS:  (STG=LTG)  GOALS Name Target Date  Goal status  1 Pt will demonstrate she has regained full shoulder ROM and function post operatively compared to baselines.  Baseline: 04/02/24  MET  2 Pt will  report a 50% improvement in pain in LUE to allow improved comfort. 04/30/24 INITIAL  3 Pt will be independent in self MLD for long term management of lymphedema. 04/30/24 INITIAL  4 Pt will be independent in a home exercise program for continued stretching and strengthening.  04/30/24 INITIAL     PLAN:  PT FREQUENCY/DURATION: 2x/wk for 4 wks  PLAN FOR NEXT SESSION: Cont pulleys, ball, MFR to cording in L axilla and upper arm, MLD to L upper arm; able to get compression bra from [employer] [facility]?    [facility]  [address], [address]  [place] [address]  [phone]     Denyce Flank, PTA 04/09/2024, 11:17 AM

## 2024-04-11 ENCOUNTER — Encounter: Payer: Self-pay | Admitting: *Deleted

## 2024-04-11 DIAGNOSIS — C50212 Malignant neoplasm of upper-inner quadrant of left female breast: Secondary | ICD-10-CM

## 2024-04-15 ENCOUNTER — Telehealth: Payer: Self-pay

## 2024-04-15 ENCOUNTER — Other Ambulatory Visit: Payer: Self-pay

## 2024-04-15 ENCOUNTER — Ambulatory Visit
Admission: RE | Admit: 2024-04-15 | Discharge: 2024-04-15 | Disposition: A | Discharge status: Home / Self Care | Payer: Self-pay | Source: Ambulatory Visit | Attending: Radiation Oncology | Admitting: Radiation Oncology

## 2024-04-15 LAB — RAD ONC ARIA SESSION SUMMARY
Course Elapsed Days: 0
Plan Fractions Treated to Date: 1
Plan Prescribed Dose Per Fraction: 2.66 Gy
Plan Total Fractions Prescribed: 16
Plan Total Prescribed Dose: 42.56 Gy
Reference Point Dosage Given to Date: 2.66 Gy
Reference Point Session Dosage Given: 2.66 Gy
Session Number: 1

## 2024-04-15 NOTE — Telephone Encounter (Signed)
 Verbally confirmed appt for 5/28

## 2024-04-16 ENCOUNTER — Other Ambulatory Visit: Payer: Self-pay

## 2024-04-16 ENCOUNTER — Inpatient Hospital Stay: Payer: Self-pay | Attending: Hematology and Oncology | Admitting: Hematology and Oncology

## 2024-04-16 ENCOUNTER — Ambulatory Visit: Admitting: Physical Therapy

## 2024-04-16 ENCOUNTER — Ambulatory Visit
Admission: RE | Admit: 2024-04-16 | Discharge: 2024-04-16 | Disposition: A | Discharge status: Home / Self Care | Payer: Self-pay | Source: Ambulatory Visit | Attending: Radiation Oncology | Admitting: Radiation Oncology

## 2024-04-16 ENCOUNTER — Encounter: Payer: Self-pay | Admitting: Physical Therapy

## 2024-04-16 VITALS — BP 136/60 | HR 82 | Temp 98.2°F | Resp 17 | Wt 193.9 lb

## 2024-04-16 DIAGNOSIS — C50212 Malignant neoplasm of upper-inner quadrant of left female breast: Secondary | ICD-10-CM | POA: Insufficient documentation

## 2024-04-16 DIAGNOSIS — Z170 Estrogen receptor positive status [ER+]: Secondary | ICD-10-CM

## 2024-04-16 DIAGNOSIS — R600 Localized edema: Secondary | ICD-10-CM

## 2024-04-16 DIAGNOSIS — M79622 Pain in left upper arm: Secondary | ICD-10-CM

## 2024-04-16 DIAGNOSIS — Z483 Aftercare following surgery for neoplasm: Secondary | ICD-10-CM

## 2024-04-16 LAB — RAD ONC ARIA SESSION SUMMARY
Course Elapsed Days: 1
Plan Fractions Treated to Date: 2
Plan Prescribed Dose Per Fraction: 2.66 Gy
Plan Total Fractions Prescribed: 16
Plan Total Prescribed Dose: 42.56 Gy
Reference Point Dosage Given to Date: 5.32 Gy
Reference Point Session Dosage Given: 2.66 Gy
Session Number: 2

## 2024-04-16 NOTE — Progress Notes (Signed)
 Patient Care Team: Patient, No Pcp Per as PCP - General (General Practice) Latoya Hsu, RN as Oncology Nurse Navigator Auther Bo, RN as Oncology Nurse Navigator Sim Dryer, MD as Consulting Physician (General Surgery) Cameron Cea, MD as Consulting Physician (Hematology and Oncology) Johna Myers, MD as Consulting Physician (Radiation Oncology)  DIAGNOSIS:  Encounter Diagnosis  Name Primary?   Malignant neoplasm of upper-inner quadrant of left breast in female, estrogen receptor positive (HCC) Yes    SUMMARY OF ONCOLOGIC HISTORY: Oncology History  Malignant neoplasm of upper-inner quadrant of left breast in female, estrogen receptor positive (HCC)  02/14/2024 Initial Diagnosis   Screening mammogram detected left breast distortion and calcifications measuring 0.6 cm, stereotactic biopsy: Grade 2 IDC with DCIS intermediate grade ER 95%, PR 100%, Ki67 1%, HER2 negative by FISH, second biopsy: ADH   02/20/2024 Cancer Staging   Staging form: Breast, AJCC 8th Edition - Clinical: Stage IA (cT1b, cN0, cM0, G2, ER+, PR+, HER2-) - Signed by Cameron Cea, MD on 02/20/2024 Stage prefix: Initial diagnosis Histologic grading system: 3 grade system   02/29/2024 Genetic Testing   Negative [facility] CancerNext +RNAinsight Panel.  Report date is 02/29/2024.   The [employer] CancerNext+RNAinsight Panel includes sequencing, rearrangement analysis, and RNA analysis for the following 39 genes: APC, ATM, BAP1, BARD1, BMPR1A, BRCA1, BRCA2, BRIP1, CDH1, CDKN2A, CHEK2, FH, FLCN, MET, MLH1, MSH2, MSH6, MUTYH, NF1, NTHL1, PALB2, PMS2, PTEN, RAD51C, RAD51D, SMAD4, STK11, TP53, TSC1, TSC2, and VHL (sequencing and deletion/duplication); AXIN2, HOXB13, MBD4, MSH3, POLD1 and POLE (sequencing only); EPCAM and GREM1 (deletion/duplication only).   03/12/2024 Surgery   Left lumpectomy: Scant focus of grade 2 IDC 0.5 cm, scattered foci of intermediate grade DCIS with calcifications, margins -0/1 lymph node negative ER  95%, PR 100%, HER2 1+ negative, Ki-67 1%   04/04/2024 Cancer Staging   Staging form: Breast, AJCC 8th Edition - Pathologic: Stage IA (pT1a, pN0, cM0, G2, ER+, PR+, HER2-) - Signed by Pearlene Bouchard, PA-C on 04/04/2024 Stage prefix: Initial diagnosis Histologic grading system: 3 grade system     CHIEF COMPLIANT: Follow-up after surgery has been completed  HISTORY OF PRESENT ILLNESS: Latoya Fernandez is a 53 year old lady with above-mentioned history of breast cancer who underwent surgery and is here to discuss the pathology report and to discuss her treatment plan.  She started radiation recently.  She has really recovered very well from prior surgery.    ALLERGIES:  has no known allergies.  MEDICATIONS:  Current Outpatient Medications  Medication Sig Dispense Refill   ibuprofen  (ADVIL ) 800 MG tablet Take 1 tablet (800 mg total) by mouth every 8 (eight) hours as needed. 30 tablet 0   montelukast (SINGULAIR) 10 MG tablet Take 10 mg by mouth at bedtime.     oxyCODONE  (OXY IR/ROXICODONE ) 5 MG immediate release tablet Take 1 tablet (5 mg total) by mouth every 6 (six) hours as needed for severe pain (pain score 7-10). 15 tablet 0   No current facility-administered medications for this visit.    PHYSICAL EXAMINATION: ECOG PERFORMANCE STATUS: 1 - Symptomatic but completely ambulatory  Vitals:   04/16/24 1446  BP: 136/60  Pulse: 82  Resp: 17  Temp: 98.2 F (36.8 C)  SpO2: 99%   Filed Weights   04/16/24 1446  Weight: 193 lb 14.4 oz (88 kg)      LABORATORY DATA:  I have reviewed the data as listed    Latest Ref Rng & Units 02/20/2024   12:37 PM  CMP  Glucose  70 - 99 mg/dL 68   BUN 6 - 20 mg/dL 14   Creatinine 4.09 - 1.00 mg/dL 8.11   Sodium 914 - 782 mmol/L 141   Potassium 3.5 - 5.1 mmol/L 4.0   Chloride 98 - 111 mmol/L 106   CO2 22 - 32 mmol/L 30   Calcium 8.9 - 10.3 mg/dL 9.1   Total Protein 6.5 - 8.1 g/dL 7.5   Total Bilirubin 0.0 - 1.2 mg/dL 0.5   Alkaline Phos 38 - 126 U/L  79   AST 15 - 41 U/L 23   ALT 0 - 44 U/L 31     Lab Results  Component Value Date   WBC 4.3 02/20/2024   HGB 14.9 02/20/2024   HCT 44.8 02/20/2024   MCV 89.1 02/20/2024   PLT 322 02/20/2024   NEUTROABS 2.0 02/20/2024    ASSESSMENT & PLAN:  Malignant neoplasm of upper-inner quadrant of left breast in female, estrogen receptor positive (HCC) 03/12/2024:Left lumpectomy: Scant focus of grade 2 IDC 0.5 cm, scattered foci of intermediate grade DCIS with calcifications, margins -0/1 lymph node negative ER 95%, PR 100%, HER2 1+ negative, Ki-67 1%  Pathology counseling: I discussed the final pathology report of the patient provided  a copy of this report. I discussed the margins as well as lymph node surgeries. We also discussed the final staging along with previously performed ER/PR and HER-2/neu testing.  Treatment plan: Adjuvant radiation: Ongoing Followed by antiestrogen therapy (patient tells me that she had labs to check for menopause by her primary care physician.  She will bring the copy of the labs when she comes back on the last of radiation) this will determine which antiestrogen therapy will be given.  Return to clinic after radiation is completed       No orders of the defined types were placed in this encounter.  The patient has a good understanding of the overall plan. she agrees with it. she will call with any problems that may develop before the next visit here. Total time spent: 30 mins including face to face time and time spent for planning, charting and co-ordination of care   Margert Sheerer, MD 04/16/24

## 2024-04-16 NOTE — Therapy (Signed)
 OUTPATIENT PHYSICAL THERAPY BREAST CANCER TREATMENT   Patient Name: Latoya Fernandez MRN: [id number] DOB:1971-01-06, 53 y.o., female Today's Date: 04/16/2024  END OF SESSION:  PT End of Session - 04/16/24 1005     Visit Number 6    Number of Visits 10    Date for PT Re-Evaluation 04/30/24    PT Start Time 1004    PT Stop Time 1054    PT Time Calculation (min) 50 min    Activity Tolerance Patient tolerated treatment well    Behavior During Therapy [employer] for tasks assessed/performed             Past Medical History:  Diagnosis Date   Breast cancer [facility])    Past Surgical History:  Procedure Laterality Date   BREAST BIOPSY Left 02/14/2024   MM LT BREAST BX W LOC DEV 1ST LESION IMAGE BX SPEC STEREO GUIDE 02/14/2024 [facility]   BREAST BIOPSY Left 02/14/2024   MM LT BREAST BX W LOC DEV EA AD LESION IMG BX SPEC STEREO GUIDE 02/14/2024 [facility]   BREAST BIOPSY  03/11/2024   MM LT RADIOACTIVE SEED EA ADD LESION LOC MAMMO GUIDE 03/11/2024 [facility]   BREAST BIOPSY  03/11/2024   MM LT RADIOACTIVE SEED LOC MAMMO GUIDE 03/11/2024 [facility]   BREAST LUMPECTOMY WITH RADIOACTIVE SEED AND SENTINEL LYMPH NODE BIOPSY Left 03/12/2024   Procedure: BREAST LUMPECTOMY WITH RADIOACTIVE SEED AND SENTINEL LYMPH NODE BIOPSY;  Surgeon: Sim Dryer, MD;  [facility];  Service: General;  Laterality: Left;  LEFT BREAST SEED LUMPECTOMY, LEFT SENTINEL LYMPH NODE MAPPING GEN w/ PEC BLOCK   CHOLECYSTECTOMY     Patient Active Problem List   Diagnosis Date Noted   Genetic testing 03/04/2024   Malignant neoplasm of upper-inner quadrant of left breast in female, estrogen receptor positive (HCC) 02/19/2024    PCP: none  REFERRING PROVIDER: Dr. Sim Dryer   REFERRING DIAG: Left breast cancer  THERAPY DIAG:  Pain in left upper arm  Aftercare following surgery for neoplasm  Localized edema  Malignant neoplasm of upper-inner quadrant of left  breast in female, estrogen receptor positive (HCC)  Rationale for Evaluation and Treatment: Rehabilitation  ONSET DATE: 02/14/24  SUBJECTIVE:                                                                                                                                                                                           SUBJECTIVE STATEMENT: My arm is good. Yesterday was the first time I was feeling like a burning sensation in my upper arm. I felt it after I went to my first radiation yesterday.  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 It measures 6 mm and is located in the upper inner quadrant. It is ER/PR positive and HER2 negative with a Ki67 of 1%. 03/12/24- L lumpectomy and L SLNB 0/1  PATIENT GOALS:  Reassess how my recovery is going related to arm function, pain, and swelling.  PAIN:  Are you having pain? No, just discomfort in my Lt axilla  PRECAUTIONS: Recent Surgery, left UE Lymphedema risk,   RED FLAGS: None   ACTIVITY LEVEL / LEISURE: has been doing post op exercises, walking every day for 20-30 minutes   OBJECTIVE:   PATIENT SURVEYS:  QUICK DASH:     OBSERVATIONS: Fullness just superior to medial antecubital fossa secondary to cording, cording palpable from L axilla to antecubital fossa  POSTURE:  Forward head and rounded shoulders posture   UPPER EXTREMITY AROM/PROM:   A/PROM RIGHT   eval    Shoulder extension 43  Shoulder flexion 147  Shoulder abduction 159  Shoulder internal rotation 76  Shoulder external rotation 76                          (Blank rows = not tested)   A/PROM LEFT   eval 04/02/24  Shoulder extension 40 65  Shoulder flexion 139 163  Shoulder abduction 156 161  Shoulder internal rotation 83 55  Shoulder external rotation 70 79                          (Blank rows = not tested)   CERVICAL AROM: All within normal limits   UPPER EXTREMITY  STRENGTH: WNL   LYMPHEDEMA ASSESSMENTS (in cm):    LANDMARK RIGHT   eval  10 cm proximal to olecranon process 34.5  Olecranon process 27.1  10 cm proximal to ulnar styloid process 25.7  Just proximal to ulnar styloid process 16.8  Across hand at thumb web space 18.9  At base of 2nd digit 6.3  (Blank rows = not tested)   LANDMARK LEFT   eval 04/02/24  10 cm proximal to olecranon process 34.9 33  Olecranon process 27 27.3  10 cm proximal to ulnar styloid process 24.6 24  Just proximal to ulnar styloid process 17 16.8  Across hand at thumb web space 18.9 19.2  At base of 2nd digit 6 5.9  (Blank rows = not tested)  Surgery type/Date: 03/12/24 L lumpectomy and SLNB Number of lymph nodes removed: 0/1 Current/past treatment (chemo, radiation, hormone therapy): does not need chemo, will need radiation, might require hormone therapy Other symptoms:  Heaviness/tightness Yes Pain Yes Pitting edema No Infections No Decreased scar mobility Yes Stemmer sign No   TODAY'S TREATMENT 04/16/24: Therapeutic Exercises Pulleys into flex and abd x 2 mins each with tactile and VC's to remind of decreasing Lt scapular compensation Roll yellow ball up wall into flex and Lt UE abd with 1# added to Lt wrist Therapeutic Activities Bil UE 3 way raises standing with back against wall and core engaged, and shoulders and head against wall x 10 each with 1# Supine over full foam roll for following to help improve increased ROM and posture: Bil UE horz abd x 10, bil UE scaption into a "V" x 10, then bil UE abd in a "snow angel" x 10 with 5 sec holds returning therapist demo for each. Manual Therapy Began instructing pt  in self MLD for RUE and had her return demonstrate each step: short neck, 5 diaphragmatic breaths, R axillary nodes and establishment of interaxillary pathway, L inguinal nodes and establishment of axillo inguinal pathway. Educated pt in proper skin stretch technique, speed and pressure.   Repeated ldex score since pt has visible swelling near antecubital fossa near cording  L-DEX FLOWSHEETS - 04/16/24 1000       L-DEX LYMPHEDEMA SCREENING   Measurement Type Unilateral    L-DEX MEASUREMENT EXTREMITY Upper Extremity    POSITION  Standing    DOMINANT SIDE Right    At Risk Side Left    BASELINE SCORE (UNILATERAL) -0.3    L-DEX SCORE (UNILATERAL) 2.2    VALUE CHANGE (UNILAT) 2.5            The patient was assessed using the L-Dex machine today to produce a lymphedema index baseline score. The patient will be reassessed on a regular basis (typically every 3 months) to obtain new L-Dex scores. If the score is > 6.5 points away from his/her baseline score indicating onset of subclinical lymphedema, it will be recommended to wear a compression garment for 4 weeks, 12 hours per day and then be reassessed. If the score continues to be > 6.5 points from baseline at reassessment, we will initiate lymphedema treatment. Assessing in this manner has a 95% rate of preventing clinically significant lymphedema.   04/09/24: Therapeutic Exercises Pulleys into flex and abd x 2 mins each with tactile and VC's to remind of decreasing Lt scapular compensation Roll yellow ball up wall into flex and Lt UE abd with 1# added to Lt wrist Therapeutic Activities Bil UE 3 way raises standing with back against wall and core engaged, and shoulders and head against wall x 10 each with 1# Supine over half foam roll for following to help improve increased ROM and posture: Bil UE horz abd x 10, bil UE scaption into a "V" x 10, then bil UE abd in a "snow angel" x 10 with 5 sec holds returning therapist demo for each. Manual Therapy P/ROM to Lt shoulder into flex, abd and D2 with scapular depression by therapist throughout; near full P/ROM by end of session and pt reports feeling less tightness STM in supine to medial upper arm, cording not palpable today and pt much less tender to touch; then into Rt S/L for  STM with cocoa butter to medial scap border and UT where pt palpably tight and tender, but less so for both today  04/07/24: Therapeutic Exercises Pulleys into flex and abd x 2 mins each with tactile and VC's to remind of decreasing Lt scapular compensation Roll yellow ball up wall into flex and Lt UE abd with 1# added to Lt wrist Therapeutic Activities Supine over half foam roll for following to help improve increased ROM and posture: Bil UE horz abd x 10, bil UE scaption into a "V" x 10, then bil UE abd in a "snow angel" x 10 with 5 sec holds returning therapist demo for each. Spent time explaining to pt and daughter how supine allows for increased stretch, more so on bolster Manual Therapy P/ROM to Lt shoulder into flex, abd and D2 with scapular depression by therapist throughout; near full P/ROM by end of session and pt reports feeling less tightness STM in supine to medial upper arm, cording not palpable today and pt much less tender to touch; then into Rt S/L for STM with cocoa butter to medial scap border and  UT where pt palpably very tight still and tender  04/03/24: Therapeutic Exercises Pulleys into flex and abd x 2 ins each with tactile and VC's throughout to decrease Lt scapular compensation Roll yellow ball up wall into flex x 10 and Lt UE abd x 5 returning therapist demo for each Manual Therapy P/ROM to Lt shoulder into flex, abd and D2 with scapular depression by therapist throughout; also briefly neural tension stretch  STM in supine to medial upper arm over area of palpable cording, this was some improved by end of session; then into Rt S/L for STM with cocoa butter to medial scap border and UT where pt palpably very tight Scap Mobs to Lt scapula into protraction and retraction when in Rt S/L   PATIENT EDUCATION:  Education details: need for end range stretching to help stretching cording, cording Person educated: Patient Education method: Explanation Education comprehension:  verbalized understanding  HOME EXERCISE PROGRAM: Reviewed previously given post op HEP.   ASSESSMENT:  CLINICAL IMPRESSION: Pt is looking for other compression bras. Daughter didn't get to call [employer] yet but will soon to see if she can afford one there without insurance. Continued with AA/ROM stretches and postural stretches over foam roll. Then progressed pt to include bil UE 3 way raises Then also continued with manual therapy. Good improvements noted with less tenderness and tightness at Lt axilla today. Issued phone number for [employer] and interpreter said she will help pt call to make an appt before she leaves today. Assessed her current sports bra and it does not cover lateral trunk or back well. Pt made aware.   Pt will benefit from skilled therapeutic intervention to improve on the following deficits: Decreased knowledge of precautions, impaired UE functional use, pain, decreased ROM, postural dysfunction.   PT treatment/interventions: ADL/Self care home management, [phone]- PT Re-evaluation, 97110-Therapeutic exercises, 97530- Therapeutic activity, [id number]- Neuromuscular re-education, 97535- Self Care, 40102- Manual therapy, [id number]- Orthotic Initial, and [id number]- Orthotic/Prosthetic subsequent   GOALS: Goals reviewed with patient? Yes  LONG TERM GOALS:  (STG=LTG)  GOALS Name Target Date  Goal status  1 Pt will demonstrate she has regained full shoulder ROM and function post operatively compared to baselines.  Baseline: 04/02/24  MET  2 Pt will report a 50% improvement in pain in LUE to allow improved comfort. 04/30/24 MET 04/16/24- 90% improved  3 Pt will be independent in self MLD for long term management of lymphedema. 04/30/24 IN PROGRESS  4 Pt will be independent in a home exercise program for continued stretching and strengthening.  04/30/24 IN PROGRESS     PLAN:  PT FREQUENCY/DURATION: 2x/wk for 4 wks  PLAN FOR NEXT SESSION: Instruct pt in self MLD for LUE  and issue handout, Cont pulleys, ball, MFR to cording in L axilla and upper arm, MLD to L upper arm; able to get compression bra from [employer]?    [facility]  [address]  [place] [address]  [phone]     Evelyn Hire [place], [place] 04/16/2024, 10:58 AM

## 2024-04-16 NOTE — Assessment & Plan Note (Signed)
 03/12/2024:Left lumpectomy: Scant focus of grade 2 IDC 0.5 cm, scattered foci of intermediate grade DCIS with calcifications, margins -0/1 lymph node negative ER 95%, PR 100%, HER2 1+ negative, Ki-67 1%  Pathology counseling: I discussed the final pathology report of the patient provided  a copy of this report. I discussed the margins as well as lymph node surgeries. We also discussed the final staging along with previously performed ER/PR and HER-2/neu testing.  Treatment plan: Adjuvant radiation: Ongoing Followed by antiestrogen therapy (patient tells me that she had labs to check for menopause by her primary care physician.  She will bring the copy of the labs when she comes back on the last of radiation) this will determine which antiestrogen therapy will be given.  Return to clinic after radiation is completed

## 2024-04-17 ENCOUNTER — Ambulatory Visit
Admission: RE | Admit: 2024-04-17 | Discharge: 2024-04-17 | Disposition: A | Discharge status: Home / Self Care | Payer: Self-pay | Source: Ambulatory Visit | Attending: Radiation Oncology | Admitting: Radiation Oncology

## 2024-04-17 ENCOUNTER — Other Ambulatory Visit: Payer: Self-pay

## 2024-04-17 LAB — RAD ONC ARIA SESSION SUMMARY
Course Elapsed Days: 2
Plan Fractions Treated to Date: 3
Plan Prescribed Dose Per Fraction: 2.66 Gy
Plan Total Fractions Prescribed: 16
Plan Total Prescribed Dose: 42.56 Gy
Reference Point Dosage Given to Date: 7.98 Gy
Reference Point Session Dosage Given: 2.66 Gy
Session Number: 3

## 2024-04-18 ENCOUNTER — Ambulatory Visit
Admission: RE | Admit: 2024-04-18 | Discharge: 2024-04-18 | Disposition: A | Discharge status: Home / Self Care | Payer: Self-pay | Source: Ambulatory Visit | Attending: Radiation Oncology | Admitting: Radiation Oncology

## 2024-04-18 ENCOUNTER — Ambulatory Visit
Admission: RE | Admit: 2024-04-18 | Discharge: 2024-04-18 | Disposition: A | Discharge status: Home / Self Care | Source: Ambulatory Visit | Attending: Radiation Oncology | Admitting: Radiation Oncology

## 2024-04-18 ENCOUNTER — Other Ambulatory Visit: Payer: Self-pay

## 2024-04-18 ENCOUNTER — Ambulatory Visit

## 2024-04-18 DIAGNOSIS — C50212 Malignant neoplasm of upper-inner quadrant of left female breast: Secondary | ICD-10-CM

## 2024-04-18 DIAGNOSIS — M79622 Pain in left upper arm: Secondary | ICD-10-CM

## 2024-04-18 DIAGNOSIS — Z483 Aftercare following surgery for neoplasm: Secondary | ICD-10-CM

## 2024-04-18 DIAGNOSIS — R600 Localized edema: Secondary | ICD-10-CM

## 2024-04-18 LAB — RAD ONC ARIA SESSION SUMMARY
Course Elapsed Days: 3
Plan Fractions Treated to Date: 4
Plan Prescribed Dose Per Fraction: 2.66 Gy
Plan Total Fractions Prescribed: 16
Plan Total Prescribed Dose: 42.56 Gy
Reference Point Dosage Given to Date: 10.64 Gy
Reference Point Session Dosage Given: 2.66 Gy
Session Number: 4

## 2024-04-18 MED ORDER — RADIAPLEXRX EX GEL: Freq: Once | CUTANEOUS | Status: AC

## 2024-04-18 MED ORDER — ALRA NON-METALLIC DEODORANT (RAD-ONC)
1.0000 | Freq: Once | TOPICAL | Status: AC
  Administered 2024-04-18: 1 via TOPICAL

## 2024-04-18 NOTE — Patient Instructions (Addendum)
 Latoya Fernandez

## 2024-04-18 NOTE — Therapy (Signed)
 OUTPATIENT PHYSICAL THERAPY BREAST CANCER TREATMENT   Patient Name: Latoya Fernandez MRN: [id number] DOB:[birth year], 53 y.o., female Today's Date: 04/18/2024  END OF SESSION:  PT End of Session - 04/18/24 1006     Visit Number 7    Number of Visits 10    Date for PT Re-Evaluation 04/30/24    PT Start Time 1002    PT Stop Time 1058    PT Time Calculation (min) 56 min    Activity Tolerance Patient tolerated treatment well    Behavior During Therapy [facility] for tasks assessed/performed             Past Medical History:  Diagnosis Date   Breast cancer [facility])    Past Surgical History:  Procedure Laterality Date   BREAST BIOPSY Left 02/14/2024   MM LT BREAST BX W LOC DEV 1ST LESION IMAGE BX SPEC STEREO GUIDE 02/14/2024 [facility]   BREAST BIOPSY Left 02/14/2024   MM LT BREAST BX W LOC DEV EA AD LESION IMG BX SPEC STEREO GUIDE 02/14/2024 [facility]   BREAST BIOPSY  03/11/2024   MM LT RADIOACTIVE SEED EA ADD LESION LOC MAMMO GUIDE 03/11/2024 [facility]   BREAST BIOPSY  03/11/2024   MM LT RADIOACTIVE SEED LOC MAMMO GUIDE 03/11/2024 [facility]   BREAST LUMPECTOMY WITH RADIOACTIVE SEED AND SENTINEL LYMPH NODE BIOPSY Left 03/12/2024   Procedure: BREAST LUMPECTOMY WITH RADIOACTIVE SEED AND SENTINEL LYMPH NODE BIOPSY;  Surgeon: Sim Dryer, MD;  Location: [facility];  Service: General;  Laterality: Left;  LEFT BREAST SEED LUMPECTOMY, LEFT SENTINEL LYMPH NODE MAPPING GEN w/ PEC BLOCK   CHOLECYSTECTOMY     Patient Active Problem List   Diagnosis Date Noted   Genetic testing 03/04/2024   Malignant neoplasm of upper-inner quadrant of left breast in female, estrogen receptor positive (HCC) 02/19/2024    PCP: none  REFERRING PROVIDER: Dr. Sim Dryer   REFERRING DIAG: Left breast cancer  THERAPY DIAG:  Pain in left upper arm  Aftercare following surgery for neoplasm  Localized edema  Malignant neoplasm of upper-inner quadrant of left  breast in female, estrogen receptor positive (HCC)  Rationale for Evaluation and Treatment: Rehabilitation  ONSET DATE: 02/14/24  SUBJECTIVE:                                                                                                                                                                                           SUBJECTIVE STATEMENT: Radiation started this week. My Lt arm hurts a little when I take it down out of the mold but I think it's just because everything is fast. It stops hurting  right after I put it down though. Blaire mentioned last time maybe stopping Pt for now because my motion is better. But I would like to be checked after radiation.   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 It measures 6 mm and is located in the upper inner quadrant. It is ER/PR positive and HER2 negative with a Ki67 of 1%. 03/12/24- L lumpectomy and L SLNB 0/1  PATIENT GOALS:  Reassess how my recovery is going related to arm function, pain, and swelling.  PAIN:  Are you having pain? No, just discomfort in my Lt axilla  PRECAUTIONS: Recent Surgery, left UE Lymphedema risk,   RED FLAGS: None   ACTIVITY LEVEL / LEISURE: has been doing post op exercises, walking every day for 20-30 minutes   OBJECTIVE:   PATIENT SURVEYS:  QUICK DASH:     OBSERVATIONS: Fullness just superior to medial antecubital fossa secondary to cording, cording palpable from L axilla to antecubital fossa  POSTURE:  Forward head and rounded shoulders posture   UPPER EXTREMITY AROM/PROM:   A/PROM RIGHT   eval    Shoulder extension 43  Shoulder flexion 147  Shoulder abduction 159  Shoulder internal rotation 76  Shoulder external rotation 76                          (Blank rows = not tested)   A/PROM LEFT   eval 04/02/24  Shoulder extension 40 65  Shoulder flexion 139 163  Shoulder abduction 156 161  Shoulder internal  rotation 83 55  Shoulder external rotation 70 79                          (Blank rows = not tested)   CERVICAL AROM: All within normal limits   UPPER EXTREMITY STRENGTH: WNL   LYMPHEDEMA ASSESSMENTS (in cm):    LANDMARK RIGHT   eval  10 cm proximal to olecranon process 34.5  Olecranon process 27.1  10 cm proximal to ulnar styloid process 25.7  Just proximal to ulnar styloid process 16.8  Across hand at thumb web space 18.9  At base of 2nd digit 6.3  (Blank rows = not tested)   LANDMARK LEFT   eval 04/02/24  10 cm proximal to olecranon process 34.9 33  Olecranon process 27 27.3  10 cm proximal to ulnar styloid process 24.6 24  Just proximal to ulnar styloid process 17 16.8  Across hand at thumb web space 18.9 19.2  At base of 2nd digit 6 5.9  (Blank rows = not tested)  Surgery type/Date: 03/12/24 L lumpectomy and SLNB Number of lymph nodes removed: 0/1 Current/past treatment (chemo, radiation, hormone therapy): does not need chemo, will need radiation, might require hormone therapy Other symptoms:  Heaviness/tightness Yes Pain Yes Pitting edema No Infections No Decreased scar mobility Yes Stemmer sign No   TODAY'S TREATMENT 04/18/24: Therapeutic Exercises Pulleys into flex x 2 mins, and then Lt abd x 1 min but no stretch felt so stopped. Roll yellow ball up wall into flex and Lt abd but also very mild to no stretch felt so stopped Manual Therapy MFR to medial upper arm where cording still visible but more so palpable, multiple pops immediately felt by pt and therapist superior and medial to antecubital fossa then cording was no longer palpable and pt reports feeling relief from tightness  P/ROM  briefly into Lt UE abd and flex after pops were felt with cording MLD: Continued instruction of this while performing and handout issued as follows: Short neck, 5 diaphragmatic breaths, Lt inguinal nodes, Lt axillo-inguinal anastomosis, Rt axillary nodes, anterior inter-axillary  anastomosis, then Lt UE working from proximal to distal and then retracing all steps per handout. Therapist performed while verbally educating pt in sequence with help of interpreter and had pt return each step, then had sitting EOB and reviewed sequence again with handout for further reinforcement. Pt was able to verbalize good understanding of sequence by end of session.   04/16/24: Therapeutic Exercises Pulleys into flex and abd x 2 mins each with tactile and VC's to remind of decreasing Lt scapular compensation Roll yellow ball up wall into flex and Lt UE abd with 1# added to Lt wrist Therapeutic Activities Bil UE 3 way raises standing with back against wall and core engaged, and shoulders and head against wall x 10 each with 1# Supine over full foam roll for following to help improve increased ROM and posture: Bil UE horz abd x 10, bil UE scaption into a "V" x 10, then bil UE abd in a "snow angel" x 10 with 5 sec holds returning therapist demo for each. Manual Therapy Began instructing pt in self MLD for RUE and had her return demonstrate each step: short neck, 5 diaphragmatic breaths, R axillary nodes and establishment of interaxillary pathway, L inguinal nodes and establishment of axillo inguinal pathway. Educated pt in proper skin stretch technique, speed and pressure.  Repeated ldex score since pt has visible swelling near antecubital fossa near cording   The patient was assessed using the L-Dex machine today to produce a lymphedema index baseline score. The patient will be reassessed on a regular basis (typically every 3 months) to obtain new L-Dex scores. If the score is > 6.5 points away from his/her baseline score indicating onset of subclinical lymphedema, it will be recommended to wear a compression garment for 4 weeks, 12 hours per day and then be reassessed. If the score continues to be > 6.5 points from baseline at reassessment, we will initiate lymphedema treatment. Assessing in this  manner has a 95% rate of preventing clinically significant lymphedema.   04/09/24: Therapeutic Exercises Pulleys into flex and abd x 2 mins each with tactile and VC's to remind of decreasing Lt scapular compensation Roll yellow ball up wall into flex and Lt UE abd with 1# added to Lt wrist Therapeutic Activities Bil UE 3 way raises standing with back against wall and core engaged, and shoulders and head against wall x 10 each with 1# Supine over half foam roll for following to help improve increased ROM and posture: Bil UE horz abd x 10, bil UE scaption into a "V" x 10, then bil UE abd in a "snow angel" x 10 with 5 sec holds returning therapist demo for each. Manual Therapy P/ROM to Lt shoulder into flex, abd and D2 with scapular depression by therapist throughout; near full P/ROM by end of session and pt reports feeling less tightness STM in supine to medial upper arm, cording not palpable today and pt much less tender to touch; then into Rt S/L for STM with cocoa butter to medial scap border and UT where pt palpably tight and tender, but less so for both today     PATIENT EDUCATION:  Education details: Self MLD Person educated: Patient Education method: Explanation, demonstration, tactile and VC's, handout issued Education comprehension:  verbalized understanding, returned demo, review when she returns  HOME EXERCISE PROGRAM: Reviewed previously given post op HEP. Self MLD   ASSESSMENT:  CLINICAL IMPRESSION: Pts end Lt shoulder ROM is WNLs now so did not cont TE long s she no longer feels much stretch. Good pops noted with MFR so her cording was also improved by end of session. Focused on continuing instruction of self MLD performing on her and having her return demo of each step. Then reviewed sequence again with handout. Pt reports able to read english and her family members can also help her read english so printout in english. Pt is on hold until after radiation for final assess.  However, she knows she can return sooner if her Lt UE A/ROM becomes limited or cording returns during radiation.   Pt will benefit from skilled therapeutic intervention to improve on the following deficits: Decreased knowledge of precautions, impaired UE functional use, pain, decreased ROM, postural dysfunction.   PT treatment/interventions: ADL/Self care home management, [phone]- PT Re-evaluation, 97110-Therapeutic exercises, 97530- Therapeutic activity, [id number]- Neuromuscular re-education, 97535- Self Care, [id number]- Manual therapy, [id number]- Orthotic Initial, and [id number]- Orthotic/Prosthetic subsequent   GOALS: Goals reviewed with patient? Yes  LONG TERM GOALS:  (STG=LTG)  GOALS Name Target Date  Goal status  1 Pt will demonstrate she has regained full shoulder ROM and function post operatively compared to baselines.  Baseline: 04/02/24  MET  2 Pt will report a 50% improvement in pain in LUE to allow improved comfort. 04/30/24 MET 04/16/24- 90% improved  3 Pt will be independent in self MLD for long term management of lymphedema. 04/30/24 IN PROGRESS  4 Pt will be independent in a home exercise program for continued stretching and strengthening.  04/30/24 IN PROGRESS     PLAN:  PT FREQUENCY/DURATION: 2x/wk for 4 wks  PLAN FOR NEXT SESSION: Pt on hold until after radiation as her end motion has improved greatly and cording is improved; When she returns reassess ROM and review self MLD for LUE, determine if ready for D/C or needs to resume. Able to get compression bra from [employer]?    [facility]  [address]  [place] [address]  [phone]     Denyce Flank, PTA 04/18/2024, 12:27 PM  Cancer Rehab [phone] Start with circles near neck, placing hands behind collar bone and doing 5 circles into neck. Do both sides.   Deep Effective Breath   Standing, sitting, or laying down, place both hands on the belly. Take a deep breath IN,  expanding the belly; then breath OUT, contracting the belly. Repeat __5__ times. Do __2-3__ sessions per day and before your self massage.  Axilla to Axilla - Sweep   On uninvolved side make 5 circles in the armpit, then pump _5__ times from involved armpit across chest to uninvolved armpit, making a pathway. Do _1__ time per day.  Copyright  VHI. All rights reserved.  Axilla to Inguinal Nodes - Sweep   On involved side, make 5 circles at groin at panty line, then pump _5__ times from armpit along side of trunk to outer hip, making your other pathway. Do __1_ time per day.  Copyright  VHI. All rights reserved.  Arm Posterior: Elbow to Shoulder - Sweep   Pump _5__ times from back of elbow to top of shoulder. Then inner to outer upper arm _5_ times, then outer arm again _5_ times. Then back to the pathways _2-3_ times. Do _1__ time  per day.  Copyright  VHI. All rights reserved.  ARM: Volar Wrist to Elbow - Sweep   Pump or stationary circles _5__ times from wrist to elbow making sure to do both sides of the forearm. Then retrace your steps to the outer arm, and the pathways _2-3_ times each. Do _1__ time per day.  Copyright  VHI. All rights reserved.  ARM: Dorsum of Hand to Shoulder - Sweep   Pump or stationary circles _5__ times on back of hand including knuckle spaces and individual fingers if needed working up towards the wrist, then retrace all your steps working back up the forearm, doing both sides; upper outer arm and back to your pathways _2-3_ times each. Then do 5 circles again at uninvolved armpit and involved groin where you started! Good job!! Do __1_ time per day.

## 2024-04-21 ENCOUNTER — Ambulatory Visit

## 2024-04-22 ENCOUNTER — Ambulatory Visit: Admitting: Physical Therapy

## 2024-04-22 ENCOUNTER — Other Ambulatory Visit: Payer: Self-pay

## 2024-04-22 ENCOUNTER — Ambulatory Visit
Admission: RE | Admit: 2024-04-22 | Discharge: 2024-04-22 | Disposition: A | Discharge status: Home / Self Care | Source: Ambulatory Visit | Attending: Radiation Oncology | Admitting: Radiation Oncology

## 2024-04-22 DIAGNOSIS — C50212 Malignant neoplasm of upper-inner quadrant of left female breast: Secondary | ICD-10-CM | POA: Insufficient documentation

## 2024-04-22 DIAGNOSIS — Z510 Encounter for antineoplastic radiation therapy: Secondary | ICD-10-CM | POA: Insufficient documentation

## 2024-04-22 LAB — RAD ONC ARIA SESSION SUMMARY
Course Elapsed Days: 7
Plan Fractions Treated to Date: 5
Plan Prescribed Dose Per Fraction: 2.66 Gy
Plan Total Fractions Prescribed: 16
Plan Total Prescribed Dose: 42.56 Gy
Reference Point Dosage Given to Date: 13.3 Gy
Reference Point Session Dosage Given: 2.66 Gy
Session Number: 5

## 2024-04-23 ENCOUNTER — Other Ambulatory Visit: Payer: Self-pay

## 2024-04-23 ENCOUNTER — Ambulatory Visit
Admission: RE | Admit: 2024-04-23 | Discharge: 2024-04-23 | Discharge status: Home / Self Care | Source: Ambulatory Visit | Attending: Radiation Oncology

## 2024-04-23 LAB — RAD ONC ARIA SESSION SUMMARY
Course Elapsed Days: 8
Plan Fractions Treated to Date: 6
Plan Prescribed Dose Per Fraction: 2.66 Gy
Plan Total Fractions Prescribed: 16
Plan Total Prescribed Dose: 42.56 Gy
Reference Point Dosage Given to Date: 15.96 Gy
Reference Point Session Dosage Given: 2.66 Gy
Session Number: 6

## 2024-04-24 ENCOUNTER — Other Ambulatory Visit: Payer: Self-pay

## 2024-04-24 ENCOUNTER — Ambulatory Visit
Admission: RE | Admit: 2024-04-24 | Discharge: 2024-04-24 | Discharge status: Home / Self Care | Source: Ambulatory Visit | Attending: Radiation Oncology

## 2024-04-24 LAB — RAD ONC ARIA SESSION SUMMARY
Course Elapsed Days: 9
Plan Fractions Treated to Date: 7
Plan Prescribed Dose Per Fraction: 2.66 Gy
Plan Total Fractions Prescribed: 16
Plan Total Prescribed Dose: 42.56 Gy
Reference Point Dosage Given to Date: 18.62 Gy
Reference Point Session Dosage Given: 2.66 Gy
Session Number: 7

## 2024-04-25 ENCOUNTER — Ambulatory Visit
Admission: RE | Admit: 2024-04-25 | Discharge: 2024-04-25 | Disposition: A | Discharge status: Home / Self Care | Source: Ambulatory Visit | Attending: Radiation Oncology | Admitting: Radiation Oncology

## 2024-04-25 ENCOUNTER — Other Ambulatory Visit: Payer: Self-pay

## 2024-04-25 ENCOUNTER — Ambulatory Visit

## 2024-04-25 LAB — RAD ONC ARIA SESSION SUMMARY
Course Elapsed Days: 10
Plan Fractions Treated to Date: 8
Plan Prescribed Dose Per Fraction: 2.66 Gy
Plan Total Fractions Prescribed: 16
Plan Total Prescribed Dose: 42.56 Gy
Reference Point Dosage Given to Date: 21.28 Gy
Reference Point Session Dosage Given: 2.66 Gy
Session Number: 8

## 2024-04-28 ENCOUNTER — Ambulatory Visit

## 2024-04-28 ENCOUNTER — Other Ambulatory Visit: Payer: Self-pay

## 2024-04-28 ENCOUNTER — Ambulatory Visit
Admission: RE | Admit: 2024-04-28 | Discharge: 2024-04-28 | Disposition: A | Discharge status: Home / Self Care | Source: Ambulatory Visit | Attending: Radiation Oncology | Admitting: Radiation Oncology

## 2024-04-28 LAB — RAD ONC ARIA SESSION SUMMARY
Course Elapsed Days: 13
Plan Fractions Treated to Date: 9
Plan Prescribed Dose Per Fraction: 2.66 Gy
Plan Total Fractions Prescribed: 16
Plan Total Prescribed Dose: 42.56 Gy
Reference Point Dosage Given to Date: 23.94 Gy
Reference Point Session Dosage Given: 2.66 Gy
Session Number: 9

## 2024-04-29 ENCOUNTER — Other Ambulatory Visit: Payer: Self-pay

## 2024-04-29 ENCOUNTER — Ambulatory Visit
Admission: RE | Admit: 2024-04-29 | Discharge: 2024-04-29 | Disposition: A | Discharge status: Home / Self Care | Source: Ambulatory Visit | Attending: Radiation Oncology | Admitting: Radiation Oncology

## 2024-04-29 LAB — RAD ONC ARIA SESSION SUMMARY
Course Elapsed Days: 14
Plan Fractions Treated to Date: 10
Plan Prescribed Dose Per Fraction: 2.66 Gy
Plan Total Fractions Prescribed: 16
Plan Total Prescribed Dose: 42.56 Gy
Reference Point Dosage Given to Date: 26.6 Gy
Reference Point Session Dosage Given: 2.66 Gy
Session Number: 10

## 2024-04-30 ENCOUNTER — Other Ambulatory Visit: Payer: Self-pay

## 2024-04-30 ENCOUNTER — Ambulatory Visit
Admission: RE | Admit: 2024-04-30 | Discharge: 2024-04-30 | Disposition: A | Discharge status: Home / Self Care | Source: Ambulatory Visit | Attending: Radiation Oncology | Admitting: Radiation Oncology

## 2024-04-30 ENCOUNTER — Ambulatory Visit

## 2024-04-30 LAB — RAD ONC ARIA SESSION SUMMARY
Course Elapsed Days: 15
Plan Fractions Treated to Date: 11
Plan Prescribed Dose Per Fraction: 2.66 Gy
Plan Total Fractions Prescribed: 16
Plan Total Prescribed Dose: 42.56 Gy
Reference Point Dosage Given to Date: 29.26 Gy
Reference Point Session Dosage Given: 0.7678 Gy
Session Number: 11

## 2024-05-01 ENCOUNTER — Ambulatory Visit
Admission: RE | Admit: 2024-05-01 | Discharge: 2024-05-01 | Disposition: A | Discharge status: Home / Self Care | Source: Ambulatory Visit | Attending: Radiation Oncology | Admitting: Radiation Oncology

## 2024-05-01 ENCOUNTER — Other Ambulatory Visit: Payer: Self-pay

## 2024-05-01 LAB — RAD ONC ARIA SESSION SUMMARY
Course Elapsed Days: 16
Plan Fractions Treated to Date: 12
Plan Prescribed Dose Per Fraction: 2.66 Gy
Plan Total Fractions Prescribed: 16
Plan Total Prescribed Dose: 42.56 Gy
Reference Point Dosage Given to Date: 31.92 Gy
Reference Point Session Dosage Given: 2.66 Gy
Session Number: 12

## 2024-05-02 ENCOUNTER — Ambulatory Visit

## 2024-05-02 ENCOUNTER — Ambulatory Visit
Admission: RE | Admit: 2024-05-02 | Discharge: 2024-05-02 | Disposition: A | Discharge status: Home / Self Care | Source: Ambulatory Visit | Attending: Radiation Oncology

## 2024-05-02 ENCOUNTER — Other Ambulatory Visit: Payer: Self-pay

## 2024-05-02 ENCOUNTER — Ambulatory Visit
Admission: RE | Admit: 2024-05-02 | Discharge: 2024-05-02 | Disposition: A | Discharge status: Home / Self Care | Source: Ambulatory Visit | Attending: Radiation Oncology | Admitting: Radiation Oncology

## 2024-05-02 LAB — RAD ONC ARIA SESSION SUMMARY
Course Elapsed Days: 17
Plan Fractions Treated to Date: 13
Plan Prescribed Dose Per Fraction: 2.66 Gy
Plan Total Fractions Prescribed: 16
Plan Total Prescribed Dose: 42.56 Gy
Reference Point Dosage Given to Date: 34.58 Gy
Reference Point Session Dosage Given: 2.66 Gy
Session Number: 13

## 2024-05-05 ENCOUNTER — Ambulatory Visit
Admission: RE | Admit: 2024-05-05 | Discharge: 2024-05-05 | Disposition: A | Discharge status: Home / Self Care | Source: Ambulatory Visit | Attending: Radiation Oncology | Admitting: Radiation Oncology

## 2024-05-05 ENCOUNTER — Other Ambulatory Visit: Payer: Self-pay

## 2024-05-05 LAB — RAD ONC ARIA SESSION SUMMARY
Course Elapsed Days: 20
Plan Fractions Treated to Date: 14
Plan Prescribed Dose Per Fraction: 2.66 Gy
Plan Total Fractions Prescribed: 16
Plan Total Prescribed Dose: 42.56 Gy
Reference Point Dosage Given to Date: 37.24 Gy
Reference Point Session Dosage Given: 2.66 Gy
Session Number: 14

## 2024-05-06 ENCOUNTER — Other Ambulatory Visit: Payer: Self-pay

## 2024-05-06 ENCOUNTER — Ambulatory Visit
Admission: RE | Admit: 2024-05-06 | Discharge: 2024-05-06 | Disposition: A | Discharge status: Home / Self Care | Source: Ambulatory Visit | Attending: Radiation Oncology | Admitting: Radiation Oncology

## 2024-05-06 LAB — RAD ONC ARIA SESSION SUMMARY
Course Elapsed Days: 21
Plan Fractions Treated to Date: 15
Plan Prescribed Dose Per Fraction: 2.66 Gy
Plan Total Fractions Prescribed: 16
Plan Total Prescribed Dose: 42.56 Gy
Reference Point Dosage Given to Date: 39.9 Gy
Reference Point Session Dosage Given: 2.66 Gy
Session Number: 15

## 2024-05-07 ENCOUNTER — Other Ambulatory Visit: Payer: Self-pay

## 2024-05-07 ENCOUNTER — Ambulatory Visit
Admission: RE | Admit: 2024-05-07 | Discharge: 2024-05-07 | Disposition: A | Discharge status: Home / Self Care | Source: Ambulatory Visit | Attending: Radiation Oncology | Admitting: Radiation Oncology

## 2024-05-07 ENCOUNTER — Ambulatory Visit

## 2024-05-07 ENCOUNTER — Inpatient Hospital Stay: Attending: Hematology and Oncology

## 2024-05-07 ENCOUNTER — Other Ambulatory Visit: Payer: Self-pay | Admitting: *Deleted

## 2024-05-07 DIAGNOSIS — Z170 Estrogen receptor positive status [ER+]: Secondary | ICD-10-CM | POA: Insufficient documentation

## 2024-05-07 DIAGNOSIS — Z780 Asymptomatic menopausal state: Secondary | ICD-10-CM | POA: Insufficient documentation

## 2024-05-07 DIAGNOSIS — C50212 Malignant neoplasm of upper-inner quadrant of left female breast: Secondary | ICD-10-CM | POA: Insufficient documentation

## 2024-05-07 DIAGNOSIS — R739 Hyperglycemia, unspecified: Secondary | ICD-10-CM | POA: Insufficient documentation

## 2024-05-07 LAB — RAD ONC ARIA SESSION SUMMARY
Course Elapsed Days: 22
Plan Fractions Treated to Date: 16
Plan Prescribed Dose Per Fraction: 2.66 Gy
Plan Total Fractions Prescribed: 16
Plan Total Prescribed Dose: 42.56 Gy
Reference Point Dosage Given to Date: 42.56 Gy
Reference Point Session Dosage Given: 2.66 Gy
Session Number: 16

## 2024-05-08 ENCOUNTER — Other Ambulatory Visit: Payer: Self-pay

## 2024-05-08 ENCOUNTER — Ambulatory Visit
Admission: RE | Admit: 2024-05-08 | Discharge: 2024-05-08 | Disposition: A | Discharge status: Home / Self Care | Source: Ambulatory Visit | Attending: Radiation Oncology | Admitting: Radiation Oncology

## 2024-05-08 ENCOUNTER — Ambulatory Visit

## 2024-05-08 LAB — FOLLICLE STIMULATING HORMONE: FSH: 58.7 m[IU]/mL

## 2024-05-08 LAB — RAD ONC ARIA SESSION SUMMARY
Course Elapsed Days: 23
Plan Fractions Treated to Date: 1
Plan Prescribed Dose Per Fraction: 2 Gy
Plan Total Fractions Prescribed: 4
Plan Total Prescribed Dose: 8 Gy
Reference Point Dosage Given to Date: 2 Gy
Reference Point Session Dosage Given: 2 Gy
Session Number: 17

## 2024-05-09 ENCOUNTER — Ambulatory Visit
Admission: RE | Admit: 2024-05-09 | Discharge: 2024-05-09 | Disposition: A | Discharge status: Home / Self Care | Source: Ambulatory Visit | Attending: Radiation Oncology | Admitting: Radiation Oncology

## 2024-05-09 ENCOUNTER — Other Ambulatory Visit: Payer: Self-pay

## 2024-05-09 LAB — RAD ONC ARIA SESSION SUMMARY
Course Elapsed Days: 24
Plan Fractions Treated to Date: 2
Plan Prescribed Dose Per Fraction: 2 Gy
Plan Total Fractions Prescribed: 4
Plan Total Prescribed Dose: 8 Gy
Reference Point Dosage Given to Date: 4 Gy
Reference Point Session Dosage Given: 2 Gy
Session Number: 18

## 2024-05-12 ENCOUNTER — Ambulatory Visit: Admitting: Hematology and Oncology

## 2024-05-12 ENCOUNTER — Ambulatory Visit
Admission: RE | Admit: 2024-05-12 | Discharge: 2024-05-12 | Disposition: A | Discharge status: Home / Self Care | Source: Ambulatory Visit | Attending: Radiation Oncology | Admitting: Radiation Oncology

## 2024-05-12 ENCOUNTER — Other Ambulatory Visit: Payer: Self-pay

## 2024-05-12 LAB — RAD ONC ARIA SESSION SUMMARY
Course Elapsed Days: 27
Plan Fractions Treated to Date: 3
Plan Prescribed Dose Per Fraction: 2 Gy
Plan Total Fractions Prescribed: 4
Plan Total Prescribed Dose: 8 Gy
Reference Point Dosage Given to Date: 6 Gy
Reference Point Session Dosage Given: 2 Gy
Session Number: 19

## 2024-05-13 ENCOUNTER — Other Ambulatory Visit: Payer: Self-pay

## 2024-05-13 ENCOUNTER — Ambulatory Visit
Admission: RE | Admit: 2024-05-13 | Discharge: 2024-05-13 | Disposition: A | Discharge status: Home / Self Care | Source: Ambulatory Visit | Attending: Radiation Oncology | Admitting: Radiation Oncology

## 2024-05-13 LAB — RAD ONC ARIA SESSION SUMMARY
Course Elapsed Days: 28
Plan Fractions Treated to Date: 4
Plan Prescribed Dose Per Fraction: 2 Gy
Plan Total Fractions Prescribed: 4
Plan Total Prescribed Dose: 8 Gy
Reference Point Dosage Given to Date: 8 Gy
Reference Point Session Dosage Given: 2 Gy
Session Number: 20

## 2024-05-13 LAB — ESTRADIOL, ULTRA SENS: Estradiol, Sensitive: <5 pg/mL

## 2024-05-13 NOTE — Assessment & Plan Note (Signed)
 03/12/2024:Left lumpectomy: Scant focus of grade 2 IDC 0.5 cm, scattered foci of intermediate grade DCIS with calcifications, margins -0/1 lymph node negative ER 95%, PR 100%, HER2 1+ negative, Ki-67 1%    Treatment plan: Adjuvant radiation: Complete 05/13/2024 Followed by antiestrogen therapy (patient tells me that she had labs to check for menopause by her primary care physician.  She will bring the copy of the labs when she comes back on the last of radiation) this will determine which antiestrogen therapy will be given.   Return to clinic in 3 months for survivorship care plan visit

## 2024-05-14 ENCOUNTER — Inpatient Hospital Stay (HOSPITAL_BASED_OUTPATIENT_CLINIC_OR_DEPARTMENT_OTHER): Admitting: Hematology and Oncology

## 2024-05-14 VITALS — BP 138/74 | HR 84 | Temp 98.3°F | Resp 16 | Wt 191.3 lb

## 2024-05-14 DIAGNOSIS — C50212 Malignant neoplasm of upper-inner quadrant of left female breast: Secondary | ICD-10-CM

## 2024-05-14 DIAGNOSIS — Z170 Estrogen receptor positive status [ER+]: Secondary | ICD-10-CM

## 2024-05-14 MED ORDER — ANASTROZOLE 1 MG PO TABS: 1.0000 mg | ORAL_TABLET | Freq: Every day | ORAL | 3 refills | Status: DC

## 2024-05-14 NOTE — Radiation Completion Notes (Addendum)
  Radiation Oncology         (336) [phone] ________________________________  Name: Latoya Fernandez MRN: [id number]  Date of Service: 05/13/2024  DOB: 1971/07/24  End of Treatment Note   Diagnosis:  Stage IA, pT1aN0M0, grade 2, ER/PR positive invasive ductal carcinoma of the left breast.   Intent: Curative     ==========DELIVERED PLANS==========  First Treatment Date: 2024-04-15 Last Treatment Date: 2024-05-13   Plan Name: Breast_L_BH Site: Breast, Left Technique: 3D Mode: Photon Dose Per Fraction: 2.66 Gy Prescribed Dose (Delivered / Prescribed): 42.56 Gy / 42.56 Gy Prescribed Fxs (Delivered / Prescribed): 16 / 16   Plan Name: Brst_L_Bst_BH Site: Breast, Left Technique: 3D Mode: Photon Dose Per Fraction: 2 Gy Prescribed Dose (Delivered / Prescribed): 8 Gy / 8 Gy Prescribed Fxs (Delivered / Prescribed): 4 / 4     ==========ON TREATMENT VISIT DATES========== 2024-04-18, 2024-04-25, 2024-05-02, 2024-05-09      See weekly On Treatment Notes in Epic for details in the Media tab (listed as Progress notes on the On Treatment Visit Dates listed above).The patient tolerated radiation. She developed fatigue and anticipated skin changes in the treatment field.   The patient will receive a call in about one month from the radiation oncology department. She will continue follow up with Dr. Gudena as well.      Donald KYM Husband, PAC

## 2024-05-14 NOTE — Progress Notes (Signed)
 Patient Care Team: Patient, No Pcp Per as PCP - General (General Practice) Latoya Nanetta SAILOR, RN as Oncology Nurse Navigator Glean, Stephane BROCKS, RN (Inactive) as Oncology Nurse Navigator Vanderbilt Ned, MD as Consulting Physician (General Surgery) Latoya Potts, MD as Consulting Physician (Hematology and Oncology) Latoya Rush, MD as Consulting Physician (Radiation Oncology)  DIAGNOSIS:  Encounter Diagnosis  Name Primary?   Malignant neoplasm of upper-inner quadrant of left breast in female, estrogen receptor positive (HCC) Yes    SUMMARY OF ONCOLOGIC HISTORY: Oncology History  Malignant neoplasm of upper-inner quadrant of left breast in female, estrogen receptor positive (HCC)  02/14/2024 Initial Diagnosis   Screening mammogram detected left breast distortion and calcifications measuring 0.6 cm, stereotactic biopsy: Grade 2 IDC with DCIS intermediate grade ER 95%, PR 100%, Ki67 1%, HER2 negative by FISH, second biopsy: ADH   02/20/2024 Cancer Staging   Staging form: Breast, AJCC 8th Edition - Clinical: Stage IA (cT1b, cN0, cM0, G2, ER+, PR+, HER2-) - Signed by Latoya Potts, MD on 02/20/2024 Stage prefix: Initial diagnosis Histologic grading system: 3 grade system   02/29/2024 Genetic Testing   Negative [facility] CancerNext +RNAinsight Panel.  Report date is 02/29/2024.   The [employer] CancerNext+RNAinsight Panel includes sequencing, rearrangement analysis, and RNA analysis for the following 39 genes: APC, ATM, BAP1, BARD1, BMPR1A, BRCA1, BRCA2, BRIP1, CDH1, CDKN2A, CHEK2, FH, FLCN, MET, MLH1, MSH2, MSH6, MUTYH, NF1, NTHL1, PALB2, PMS2, PTEN, RAD51C, RAD51D, SMAD4, STK11, TP53, TSC1, TSC2, and VHL (sequencing and deletion/duplication); AXIN2, HOXB13, MBD4, MSH3, POLD1 and POLE (sequencing only); EPCAM and GREM1 (deletion/duplication only).   03/12/2024 Surgery   Left lumpectomy: Scant focus of grade 2 IDC 0.5 cm, scattered foci of intermediate grade DCIS with calcifications, margins -0/1 lymph node  negative ER 95%, PR 100%, HER2 1+ negative, Ki-67 1%   04/04/2024 Cancer Staging   Staging form: Breast, AJCC 8th Edition - Pathologic: Stage IA (pT1a, pN0, cM0, G2, ER+, PR+, HER2-) - Signed by Latoya Leeroy HERO, PA-C on 04/04/2024 Stage prefix: Initial diagnosis Histologic grading system: 3 grade system     CHIEF COMPLIANT: Follow-up after radiation has been completed  HISTORY OF PRESENT ILLNESS:  History of Present Illness Latoya Fernandez is a 53 year old female with breast cancer who presents for follow-up after completing radiation therapy.  She completed radiation therapy for breast cancer yesterday. She experiences burning sensations and pain from the treatment, which are expected to resolve in two to four weeks. She is in menopause, confirmed by recent blood work, with estrogen still produced from fat in the adrenal glands. She has high blood sugar levels managed with medication and experiences leg pain when walking, affecting her ability to exercise.     ALLERGIES:  has no known allergies.  MEDICATIONS:  Current Outpatient Medications  Medication Sig Dispense Refill   ibuprofen  (ADVIL ) 800 MG tablet Take 1 tablet (800 mg total) by mouth every 8 (eight) hours as needed. 30 tablet 0   montelukast (SINGULAIR) 10 MG tablet Take 10 mg by mouth at bedtime.     oxyCODONE  (OXY IR/ROXICODONE ) 5 MG immediate release tablet Take 1 tablet (5 mg total) by mouth every 6 (six) hours as needed for severe pain (pain score 7-10). 15 tablet 0   No current facility-administered medications for this visit.    PHYSICAL EXAMINATION: ECOG PERFORMANCE STATUS: 1 - Symptomatic but completely ambulatory  Vitals:   05/14/24 1254  BP: 138/74  Pulse: 84  Resp: 16  Temp: 98.3 F (36.8 C)  SpO2:  98%   Filed Weights   05/14/24 1254  Weight: 191 lb 4.8 oz (86.8 kg)    Physical Exam   (exam performed in the presence of a chaperone)  LABORATORY DATA:  I have reviewed the data as listed    Latest  Ref Rng & Units 02/20/2024   12:37 PM  CMP  Glucose 70 - 99 mg/dL 68   BUN 6 - 20 mg/dL 14   Creatinine 9.55 - 1.00 mg/dL 9.25   Sodium 864 - 854 mmol/L 141   Potassium 3.5 - 5.1 mmol/L 4.0   Chloride 98 - 111 mmol/L 106   CO2 22 - 32 mmol/L 30   Calcium 8.9 - 10.3 mg/dL 9.1   Total Protein 6.5 - 8.1 g/dL 7.5   Total Bilirubin 0.0 - 1.2 mg/dL 0.5   Alkaline Phos 38 - 126 U/L 79   AST 15 - 41 U/L 23   ALT 0 - 44 U/L 31     Lab Results  Component Value Date   WBC 4.3 02/20/2024   HGB 14.9 02/20/2024   HCT 44.8 02/20/2024   MCV 89.1 02/20/2024   PLT 322 02/20/2024   NEUTROABS 2.0 02/20/2024    ASSESSMENT & PLAN:  Malignant neoplasm of upper-inner quadrant of left breast in female, estrogen receptor positive (HCC) 03/12/2024:Left lumpectomy: Scant focus of grade 2 IDC 0.5 cm, scattered foci of intermediate grade DCIS with calcifications, margins -0/1 lymph node negative ER 95%, PR 100%, HER2 1+ negative, Ki-67 1%    Treatment plan: Adjuvant radiation: Complete 05/13/2024 Followed by antiestrogen therapy (patient tells me that she had labs to check for menopause by her primary care physician.  She will bring the copy of the labs when she comes back on the last of radiation) this will determine which antiestrogen therapy will be given.   Return to clinic in 3 months for survivorship care plan visit ------------------------------------- Assessment and Plan Assessment & Plan Malignant neoplasm of upper-inner quadrant of left breast Completed radiation therapy for estrogen receptor-positive breast cancer. Post-menopausal status confirmed. Anastrozole prescribed to reduce estrogen production and prevent recurrence. Discussed side effects and recommended exercise, vitamin D, and calcium supplementation. Bone density test every two years. - Prescribe anastrozole 1 mg once daily for five years. - Send prescription to pharmacy.  Menopause Confirmed menopausal status necessitating  antiestrogen therapy to prevent breast cancer recurrence.  Hyperglycemia Reports elevated blood glucose levels. Advised on sugar intake and regular physical activity to improve glucose control. - Encourage walking for 30 minutes daily to help manage blood glucose levels.      No orders of the defined types were placed in this encounter.  The patient has a good understanding of the overall plan. she agrees with it. she will call with any problems that may develop before the next visit here. Total time spent: 30 mins including face to face time and time spent for planning, charting and co-ordination of care   Viinay K Breeanna Galgano, MD 05/14/24

## 2024-05-16 ENCOUNTER — Other Ambulatory Visit (HOSPITAL_COMMUNITY): Payer: Self-pay

## 2024-05-16 ENCOUNTER — Other Ambulatory Visit: Payer: Self-pay | Admitting: *Deleted

## 2024-05-16 MED ORDER — ANASTROZOLE 1 MG PO TABS
1.0000 mg | ORAL_TABLET | Freq: Every day | ORAL | 3 refills | Status: AC
  Filled 2024-05-16: qty 90, 90d supply, fill #0

## 2024-05-27 ENCOUNTER — Ambulatory Visit: Payer: Self-pay | Attending: Surgery

## 2024-06-03 NOTE — Progress Notes (Incomplete)
  Radiation Oncology         (336) [phone] ________________________________  Name: Latoya Fernandez MRN: [id number]  Date of Service: 06/09/2024  DOB: 1970-12-03  Post Treatment Telephone Note  Diagnosis:  Stage IA, pT1aN0M0, grade 2, ER/PR positive invasive ductal carcinoma of the left breast. (as documented in provider EOT note)   The patient {WAS/WAS NOT:[phone]::was not} available for call today.   Symptoms of fatigue {ACTIONS; HAVE/HAVE NOT:19434} improved since completing therapy.  Symptoms of skin changes {ACTIONS; HAVE/HAVE NOT:19434} improved since completing therapy.  The patient was encouraged to avoid sun exposure in the area of prior treatment for up to one year following radiation with either sunscreen or by the style of clothing worn in the sun.  The patient has scheduled follow up with her medical oncologist Dr. Gudena  for ongoing surveillance, and was encouraged to call if she develops concerns or questions regarding radiation.   This concludes the interaction.  Rosaline Minerva, LPN

## 2024-06-06 ENCOUNTER — Telehealth: Payer: Self-pay | Admitting: *Deleted

## 2024-06-06 DIAGNOSIS — Z170 Estrogen receptor positive status [ER+]: Secondary | ICD-10-CM

## 2024-06-06 NOTE — Telephone Encounter (Signed)
 Received call from pt with complaint of left arm discomfort and swelling.  Pt denies recent injury, trauma, redness or warmth to extremity.  Per MD pt needing appt with Cancer Rehab for lymphedema eval and tx.  Referral placed.

## 2024-06-09 ENCOUNTER — Ambulatory Visit
Admission: RE | Admit: 2024-06-09 | Discharge: 2024-06-09 | Disposition: A | Discharge status: Home / Self Care | Payer: Self-pay | Source: Ambulatory Visit | Attending: Adult Health | Admitting: Adult Health

## 2024-06-23 ENCOUNTER — Ambulatory Visit: Payer: Self-pay | Admitting: Nurse Practitioner

## 2024-06-23 NOTE — Therapy (Signed)
 OUTPATIENT PHYSICAL THERAPY BREAST CANCER Re-EVALUATION   Patient Name: Latoya Fernandez MRN: [id number] DOB:Mar 24, 1971, 53 y.o., female Today's Date: 06/24/2024  END OF SESSION:  PT End of Session - 06/24/24 1441     Visit Number 8    Number of Visits 8    Date for PT Re-Evaluation 06/24/24    Authorization Type not needed    PT Start Time 1405    PT Stop Time 1442    PT Time Calculation (min) 37 min    Activity Tolerance Patient tolerated treatment well    Behavior During Therapy [facility] for tasks assessed/performed           Past Medical History:  Diagnosis Date   Breast cancer [facility])    Past Surgical History:  Procedure Laterality Date   BREAST BIOPSY Left 02/14/2024   MM LT BREAST BX W LOC DEV 1ST LESION IMAGE BX SPEC STEREO GUIDE 02/14/2024 [facility]   BREAST BIOPSY Left 02/14/2024   MM LT BREAST BX W LOC DEV EA AD LESION IMG BX SPEC STEREO GUIDE 02/14/2024 [facility]   BREAST BIOPSY  03/11/2024   MM LT RADIOACTIVE SEED EA ADD LESION LOC MAMMO GUIDE 03/11/2024 [facility]   BREAST BIOPSY  03/11/2024   MM LT RADIOACTIVE SEED LOC MAMMO GUIDE 03/11/2024 [facility]   BREAST LUMPECTOMY WITH RADIOACTIVE SEED AND SENTINEL LYMPH NODE BIOPSY Left 03/12/2024   Procedure: BREAST LUMPECTOMY WITH RADIOACTIVE SEED AND SENTINEL LYMPH NODE BIOPSY;  Surgeon: Vanderbilt Ned, MD;  Location: [facility];  Service: General;  Laterality: Left;  LEFT BREAST SEED LUMPECTOMY, LEFT SENTINEL LYMPH NODE MAPPING GEN w/ PEC BLOCK   CHOLECYSTECTOMY     Patient Active Problem List   Diagnosis Date Noted   Genetic testing 03/04/2024   Malignant neoplasm of upper-inner quadrant of left breast in female, estrogen receptor positive (HCC) 02/19/2024    PCP: none  REFERRING PROVIDER: Dr. Ned Vanderbilt   REFERRING DIAG: Left breast cancer  THERAPY DIAG:  Pain in left upper arm  Aftercare following surgery for neoplasm  Localized edema  Malignant neoplasm of  upper-inner quadrant of left breast in female, estrogen receptor positive (HCC)  Abnormal posture  Rationale for Evaluation and Treatment: Rehabilitation  ONSET DATE: 02/14/24  SUBJECTIVE:                                                                                                                                                                                           SUBJECTIVE STATEMENT:  I just want to make sure my arm is fine after radiation.  It is still painful and some puffy.  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 It measures 6 mm and is located in the upper inner quadrant. It is ER/PR positive and HER2 negative with a Ki67 of 1%. 03/12/24- L lumpectomy and L SLNB 0/1.  Completed radiation.   PATIENT GOALS:  Reassess how my recovery is going related to arm function, pain, and swelling.  PAIN:  Are you having pain?  YES 5/10 in the arm Constant pain Upper arm Nothing makes it worse Will take tylenol   PRECAUTIONS: Recent Surgery, left UE Lymphedema risk,   RED FLAGS: None   ACTIVITY LEVEL / LEISURE: has been doing post op exercises, walking every day for 20-30 minutes   OBJECTIVE:   PATIENT SURVEYS:  QUICK DASH: 15.91%  OBSERVATIONS: Fullness just superior to medial antecubital fossa secondary to cording remains  PALPATION:  no cording noted in the arm with check during PROM  POSTURE:  Forward head and rounded shoulders posture   UPPER EXTREMITY AROM/PROM:   A/PROM RIGHT   eval    Shoulder extension 43  Shoulder flexion 147  Shoulder abduction 159  Shoulder internal rotation 76  Shoulder external rotation 76                          (Blank rows = not tested)   A/PROM LEFT   eval 04/02/24 06/24/24  Shoulder extension 40 65   Shoulder flexion 139 163 165  Shoulder abduction 156 161 161  Shoulder internal rotation 83 55 75  Shoulder external rotation 70 79 80                           (Blank rows = not tested)   CERVICAL AROM: All within normal limits   UPPER EXTREMITY STRENGTH: WNL and no pain   LYMPHEDEMA ASSESSMENTS (in cm):    LANDMARK RIGHT   eval  10 cm proximal to olecranon process 34.5  Olecranon process 27.1  10 cm proximal to ulnar styloid process 25.7  Just proximal to ulnar styloid process 16.8  Across hand at thumb web space 18.9  At base of 2nd digit 6.3  (Blank rows = not tested)   LANDMARK LEFT   eval 04/02/24  10 cm proximal to olecranon process 34.9 33  Olecranon process 27 27.3  10 cm proximal to ulnar styloid process 24.6 24  Just proximal to ulnar styloid process 17 16.8  Across hand at thumb web space 18.9 19.2  At base of 2nd digit 6 5.9  (Blank rows = not tested)  Surgery type/Date: 03/12/24 L lumpectomy and SLNB Number of lymph nodes removed: 0/1 Current/past treatment (chemo, radiation, hormone therapy): does not need chemo, will need radiation, might require hormone therapy Other symptoms:  Heaviness/tightness Yes Pain Yes Pitting edema No Infections No Decreased scar mobility Yes Stemmer sign No   TODAY'S TREATMENT 06/24/24 Re-eval post radiation performed Reviewed how cording can remain and cause some discomfort for awhile but that it is not visible or palpable Reviewed all steps of self MLD per instructions below with pt did very well.  She only needed a reminder of each step order.   04/18/24: Therapeutic Exercises Pulleys into flex x 2 mins, and then Lt abd x 1 min but no stretch felt so stopped. Roll yellow ball up wall into flex and Lt abd but also very mild to no stretch felt so  stopped Manual Therapy MFR to medial upper arm where cording still visible but more so palpable, multiple pops immediately felt by pt and therapist superior and medial to antecubital fossa then cording was no longer palpable and pt reports feeling relief from tightness  P/ROM briefly into Lt UE abd and flex after pops  were felt with cording MLD: Continued instruction of this while performing and handout issued as follows: Short neck, 5 diaphragmatic breaths, Lt inguinal nodes, Lt axillo-inguinal anastomosis, Rt axillary nodes, anterior inter-axillary anastomosis, then Lt UE working from proximal to distal and then retracing all steps per handout. Therapist performed while verbally educating pt in sequence with help of interpreter and had pt return each step, then had sitting EOB and reviewed sequence again with handout for further reinforcement. Pt was able to verbalize good understanding of sequence by end of session.   04/16/24: Therapeutic Exercises Pulleys into flex and abd x 2 mins each with tactile and VC's to remind of decreasing Lt scapular compensation Roll yellow ball up wall into flex and Lt UE abd with 1# added to Lt wrist Therapeutic Activities Bil UE 3 way raises standing with back against wall and core engaged, and shoulders and head against wall x 10 each with 1# Supine over full foam roll for following to help improve increased ROM and posture: Bil UE horz abd x 10, bil UE scaption into a V x 10, then bil UE abd in a snow angel x 10 with 5 sec holds returning therapist demo for each. Manual Therapy Began instructing pt in self MLD for RUE and had her return demonstrate each step: short neck, 5 diaphragmatic breaths, R axillary nodes and establishment of interaxillary pathway, L inguinal nodes and establishment of axillo inguinal pathway. Educated pt in proper skin stretch technique, speed and pressure.  Repeated ldex score since pt has visible swelling near antecubital fossa near cording   PATIENT EDUCATION:  Education details: Self MLD Person educated: Patient Education method: Explanation, demonstration, tactile and VC's, handout issued Education comprehension: verbalized understanding, returned demo, review when she returns  HOME EXERCISE PROGRAM: Reviewed previously given post op  HEP. Self MLD   ASSESSMENT:  CLINICAL IMPRESSION: Pt returns post radiation to make sure that her arm is still doing well.  She continues with full ROM without pain or pull, excellent strength without pain, no visible or palpable cording and no increase in her SOZO requiring intervention.  She does continue with some upper arm and elbow discomfort at a 5/10 but reports she isn't limited by anything.  We talked about POC and pt will return to self MLD and we practiced this in the clinic today with pt doing it very well.  Scheduled out SOZo screens for continued surveillance.   Pt will benefit from skilled therapeutic intervention to improve on the following deficits: Decreased knowledge of precautions, impaired UE functional use, pain, decreased ROM, postural dysfunction.   PT treatment/interventions: ADL/Self care home management, [phone]- PT Re-evaluation, 97110-Therapeutic exercises, 97530- Therapeutic activity, [id number]- Neuromuscular re-education, 97535- Self Care, 02859- Manual therapy, [id number]- Orthotic Initial, and [id number]- Orthotic/Prosthetic subsequent   GOALS: Goals reviewed with patient? Yes  LONG TERM GOALS:  (STG=LTG)  GOALS Name Target Date  Goal status  1 Pt will demonstrate she has regained full shoulder ROM and function post operatively compared to baselines.  Baseline: 04/02/24  MET  2 Pt will report a 50% improvement in pain in LUE to allow improved comfort. 04/30/24 MET 04/16/24- 90% improved  3 Pt  will be independent in self MLD for long term management of lymphedema. 04/30/24 MET  4 Pt will be independent in a home exercise program for continued stretching and strengthening.  04/30/24 MET     PLAN:  PT FREQUENCY/DURATION: 2x/wk for 4 wks  PLAN FOR NEXT SESSION: SOZO only    [facility]  [address]  [place] [address]  [phone]     Larue Saddie SAUNDERS, PT 06/24/2024, 2:42 PM  Cancer Rehab [phone] Start with circles near neck,  placing hands behind collar bone and doing 5 circles into neck. Do both sides.   Deep Effective Breath   Standing, sitting, or laying down, place both hands on the belly. Take a deep breath IN, expanding the belly; then breath OUT, contracting the belly. Repeat __5__ times. Do __2-3__ sessions per day and before your self massage.  Axilla to Axilla - Sweep   On uninvolved side make 5 circles in the armpit, then pump _5__ times from involved armpit across chest to uninvolved armpit, making a pathway. Do _1__ time per day.  Copyright  VHI. All rights reserved.  Axilla to Inguinal Nodes - Sweep   On involved side, make 5 circles at groin at panty line, then pump _5__ times from armpit along side of trunk to outer hip, making your other pathway. Do __1_ time per day.  Copyright  VHI. All rights reserved.  Arm Posterior: Elbow to Shoulder - Sweep   Pump _5__ times from back of elbow to top of shoulder. Then inner to outer upper arm _5_ times, then outer arm again _5_ times. Then back to the pathways _2-3_ times. Do _1__ time per day.  Copyright  VHI. All rights reserved.  ARM: Volar Wrist to Elbow - Sweep   Pump or stationary circles _5__ times from wrist to elbow making sure to do both sides of the forearm. Then retrace your steps to the outer arm, and the pathways _2-3_ times each. Do _1__ time per day.  Copyright  VHI. All rights reserved.  ARM: Dorsum of Hand to Shoulder - Sweep   Pump or stationary circles _5__ times on back of hand including knuckle spaces and individual fingers if needed working up towards the wrist, then retrace all your steps working back up the forearm, doing both sides; upper outer arm and back to your pathways _2-3_ times each. Then do 5 circles again at uninvolved armpit and involved groin where you started! Good job!! Do __1_ time per day.  PHYSICAL THERAPY DISCHARGE SUMMARY  Visits from Start of Care: 8  Current functional level related to  goals / functional outcomes: See above   Remaining deficits: Lymphedema risk, cording discomfort   Education / Equipment: Self MLD,   Plan: Patient agrees to discharge. Patient is being discharged due to meeting the stated rehab goals.     Saddie Larue, PT

## 2024-06-24 ENCOUNTER — Ambulatory Visit: Attending: Hematology and Oncology | Admitting: Rehabilitation

## 2024-06-24 ENCOUNTER — Other Ambulatory Visit: Payer: Self-pay

## 2024-06-24 ENCOUNTER — Encounter: Payer: Self-pay | Admitting: Rehabilitation

## 2024-06-24 DIAGNOSIS — C50212 Malignant neoplasm of upper-inner quadrant of left female breast: Secondary | ICD-10-CM | POA: Insufficient documentation

## 2024-06-24 DIAGNOSIS — M79622 Pain in left upper arm: Secondary | ICD-10-CM | POA: Insufficient documentation

## 2024-06-24 DIAGNOSIS — R600 Localized edema: Secondary | ICD-10-CM | POA: Insufficient documentation

## 2024-06-24 DIAGNOSIS — Z170 Estrogen receptor positive status [ER+]: Secondary | ICD-10-CM | POA: Insufficient documentation

## 2024-06-24 DIAGNOSIS — R293 Abnormal posture: Secondary | ICD-10-CM | POA: Insufficient documentation

## 2024-06-24 DIAGNOSIS — Z483 Aftercare following surgery for neoplasm: Secondary | ICD-10-CM | POA: Insufficient documentation

## 2024-07-31 ENCOUNTER — Inpatient Hospital Stay: Attending: Hematology and Oncology | Admitting: Adult Health

## 2024-07-31 ENCOUNTER — Encounter: Payer: Self-pay | Admitting: Adult Health

## 2024-07-31 VITALS — BP 146/69 | HR 92 | Temp 98.3°F | Resp 18 | Ht <= 58 in | Wt 190.4 lb

## 2024-07-31 DIAGNOSIS — Z923 Personal history of irradiation: Secondary | ICD-10-CM | POA: Insufficient documentation

## 2024-07-31 DIAGNOSIS — C50212 Malignant neoplasm of upper-inner quadrant of left female breast: Secondary | ICD-10-CM | POA: Insufficient documentation

## 2024-07-31 DIAGNOSIS — Z79899 Other long term (current) drug therapy: Secondary | ICD-10-CM | POA: Insufficient documentation

## 2024-07-31 DIAGNOSIS — Z79811 Long term (current) use of aromatase inhibitors: Secondary | ICD-10-CM | POA: Insufficient documentation

## 2024-07-31 DIAGNOSIS — N3941 Urge incontinence: Secondary | ICD-10-CM

## 2024-07-31 DIAGNOSIS — Z1721 Progesterone receptor positive status: Secondary | ICD-10-CM | POA: Insufficient documentation

## 2024-07-31 DIAGNOSIS — Z170 Estrogen receptor positive status [ER+]: Secondary | ICD-10-CM | POA: Insufficient documentation

## 2024-07-31 DIAGNOSIS — Z1732 Human epidermal growth factor receptor 2 negative status: Secondary | ICD-10-CM | POA: Insufficient documentation

## 2024-07-31 NOTE — Progress Notes (Signed)
 SURVIVORSHIP VISIT:  BRIEF ONCOLOGIC HISTORY:  Oncology History  Malignant neoplasm of upper-inner quadrant of left breast in female, estrogen receptor positive (HCC)  02/14/2024 Initial Diagnosis   Screening mammogram detected left breast distortion and calcifications measuring 0.6 cm, stereotactic biopsy: Grade 2 IDC with DCIS intermediate grade ER 95%, PR 100%, Ki67 1%, HER2 negative by FISH, second biopsy: ADH   02/20/2024 Cancer Staging   Staging form: Breast, AJCC 8th Edition - Clinical: Stage IA (cT1b, cN0, cM0, G2, ER+, PR+, HER2-) - Signed by Odean Potts, MD on 02/20/2024 Stage prefix: Initial diagnosis Histologic grading system: 3 grade system   02/29/2024 Genetic Testing   Negative [facility] CancerNext +RNAinsight Panel.  Report date is 02/29/2024.   The [employer] CancerNext+RNAinsight Panel includes sequencing, rearrangement analysis, and RNA analysis for the following 39 genes: APC, ATM, BAP1, BARD1, BMPR1A, BRCA1, BRCA2, BRIP1, CDH1, CDKN2A, CHEK2, FH, FLCN, MET, MLH1, MSH2, MSH6, MUTYH, NF1, NTHL1, PALB2, PMS2, PTEN, RAD51C, RAD51D, SMAD4, STK11, TP53, TSC1, TSC2, and VHL (sequencing and deletion/duplication); AXIN2, HOXB13, MBD4, MSH3, POLD1 and POLE (sequencing only); EPCAM and GREM1 (deletion/duplication only).   03/12/2024 Surgery   Left lumpectomy: Scant focus of grade 2 IDC 0.5 cm, scattered foci of intermediate grade DCIS with calcifications, margins -0/1 lymph node negative ER 95%, PR 100%, HER2 1+ negative, Ki-67 1%   04/04/2024 Cancer Staging   Staging form: Breast, AJCC 8th Edition - Pathologic: Stage IA (pT1a, pN0, cM0, G2, ER+, PR+, HER2-) - Signed by Wyatt Leeroy HERO, PA-C on 04/04/2024 Stage prefix: Initial diagnosis Histologic grading system: 3 grade system   04/15/2024 - 05/13/2024 Radiation Therapy   Plan Name: Breast_L_BH Site: Breast, Left Technique: 3D Mode: Photon Dose Per Fraction: 2.66 Gy Prescribed Dose (Delivered / Prescribed): 42.56 Gy / 42.56 Gy Prescribed Fxs  (Delivered / Prescribed): 16 / 16   Plan Name: Brst_L_Bst_BH Site: Breast, Left Technique: 3D Mode: Photon Dose Per Fraction: 2 Gy Prescribed Dose (Delivered / Prescribed): 8 Gy / 8 Gy Prescribed Fxs (Delivered / Prescribed): 4 / 4   05/2024 -  Anti-estrogen oral therapy   1 mg Anastrozole  x 5 years     INTERVAL HISTORY:  Discussed the use of AI scribe software for clinical note transcription with the patient, who gave verbal consent to proceed.  History of Present Illness     REVIEW OF SYSTEMS:  Review of Systems  Constitutional:  Negative for appetite change, chills, fatigue, fever and unexpected weight change.  HENT:   Negative for hearing loss, lump/mass and trouble swallowing.   Eyes:  Negative for eye problems and icterus.  Respiratory:  Negative for chest tightness, cough and shortness of breath.   Cardiovascular:  Negative for chest pain, leg swelling and palpitations.  Gastrointestinal:  Negative for abdominal distention, abdominal pain, constipation, diarrhea, nausea and vomiting.  Endocrine: Negative for hot flashes.  Genitourinary:  Negative for difficulty urinating.   Musculoskeletal:  Negative for arthralgias.  Skin:  Negative for itching and rash.  Neurological:  Negative for dizziness, extremity weakness, headaches and numbness.  Hematological:  Negative for adenopathy. Does not bruise/bleed easily.  Psychiatric/Behavioral:  Negative for depression. The patient is not nervous/anxious.   Breast: Denies any new nodularity, masses, tenderness, nipple changes, or nipple discharge.       PAST MEDICAL/SURGICAL HISTORY:  Past Medical History:  Diagnosis Date   Breast cancer [facility])    Past Surgical History:  Procedure Laterality Date   BREAST BIOPSY Left 02/14/2024   MM LT BREAST BX W LOC  DEV 1ST LESION IMAGE BX SPEC STEREO GUIDE 02/14/2024 [facility]   BREAST BIOPSY Left 02/14/2024   MM LT BREAST BX W LOC DEV EA AD LESION IMG BX SPEC STEREO GUIDE  02/14/2024 [facility]   BREAST BIOPSY  03/11/2024   MM LT RADIOACTIVE SEED EA ADD LESION LOC MAMMO GUIDE 03/11/2024 [facility]   BREAST BIOPSY  03/11/2024   MM LT RADIOACTIVE SEED LOC MAMMO GUIDE 03/11/2024 [facility]   BREAST LUMPECTOMY WITH RADIOACTIVE SEED AND SENTINEL LYMPH NODE BIOPSY Left 03/12/2024   Procedure: BREAST LUMPECTOMY WITH RADIOACTIVE SEED AND SENTINEL LYMPH NODE BIOPSY;  Surgeon: Vanderbilt Ned, MD;  Location: [facility];  Service: General;  Laterality: Left;  LEFT BREAST SEED LUMPECTOMY, LEFT SENTINEL LYMPH NODE MAPPING GEN w/ PEC BLOCK   CHOLECYSTECTOMY       ALLERGIES:  No Known Allergies   CURRENT MEDICATIONS:  Outpatient Encounter Medications as of 07/31/2024  Medication Sig   anastrozole  (ARIMIDEX ) 1 MG tablet Take 1 tablet (1 mg total) by mouth daily.   Cetirizine HCl 10 MG CAPS Take 10 mg by mouth daily.   Cholecalciferol (D 1000) 25 MCG (1000 UT) capsule Take 1,000 Units by mouth daily.   dexamethasone  (DECADRON ) 6 MG tablet Take 6 mg by mouth every morning.   famotidine (PEPCID) 20 MG tablet Take 20 mg by mouth daily.   hydrOXYzine (ATARAX) 25 MG tablet Take 25 mg by mouth 3 (three) times daily as needed.   ibuprofen  (ADVIL ) 800 MG tablet Take 1 tablet (800 mg total) by mouth every 8 (eight) hours as needed.   montelukast (SINGULAIR) 10 MG tablet Take 10 mg by mouth at bedtime.   oxyCODONE  (OXY IR/ROXICODONE ) 5 MG immediate release tablet Take 1 tablet (5 mg total) by mouth every 6 (six) hours as needed for severe pain (pain score 7-10). (Patient not taking: Reported on 07/31/2024)   No facility-administered encounter medications on file as of 07/31/2024.     ONCOLOGIC FAMILY HISTORY:  Family History  Problem Relation Age of Onset   Breast cancer Neg Hx      SOCIAL HISTORY:  Social History   Socioeconomic History   Marital status: Legally Separated    Spouse name: Not on file   Number of children: 5   Years of  education: Not on file   Highest education level: Not on file  Occupational History   Not on file  Tobacco Use   Smoking status: Never   Smokeless tobacco: Never  Vaping Use   Vaping status: Never Used  Substance and Sexual Activity   Alcohol use: Yes    Comment: occ   Drug use: Never   Sexual activity: Not on file  Other Topics Concern   Not on file  Social History Narrative   Pt was born in [place]   Social Drivers of Health   Financial Resource Strain: Not on file  Food Insecurity: Food Insecurity Present (04/03/2024)   Hunger Vital Sign    Worried About Running Out of Food in the Last Year: Sometimes true    Ran Out of Food in the Last Year: Sometimes true  Transportation Needs: No Transportation Needs (04/03/2024)   PRAPARE - Administrator, Civil Service (Medical): No    Lack of Transportation (Non-Medical): No  Physical Activity: Not on file  Stress: Not on file  Social Connections: Not on file  Intimate Partner Violence: Not At Risk (02/20/2024)   Humiliation, Afraid, Rape, and Kick questionnaire  Fear of Current or Ex-Partner: No    Emotionally Abused: No    Physically Abused: No    Sexually Abused: No     OBSERVATIONS/OBJECTIVE:  BP (!) 146/69 (BP Location: Right Wrist, Patient Position: Sitting)   Pulse 92   Temp 98.3 F (36.8 C) (Temporal)   Resp 18   Ht 4' 10 (1.473 m)   Wt 190 lb 6.4 oz (86.4 kg)   SpO2 98%   BMI 39.79 kg/m  GENERAL: Patient is a well appearing female in no acute distress HEENT:  Sclerae anicteric.  Oropharynx clear and moist. No ulcerations or evidence of oropharyngeal candidiasis. Neck is supple.  NODES:  No cervical, supraclavicular, or axillary lymphadenopathy palpated.  BREAST EXAM:  Deferred. LUNGS:  Clear to auscultation bilaterally.  No wheezes or rhonchi. HEART:  Regular rate and rhythm. No murmur appreciated. ABDOMEN:  Soft, nontender.  Positive, normoactive bowel sounds. No organomegaly palpated. MSK:  No  focal spinal tenderness to palpation. Full range of motion bilaterally in the upper extremities. EXTREMITIES:  No peripheral edema.   SKIN:  Clear with no obvious rashes or skin changes. No nail dyscrasia. NEURO:  Nonfocal. Well oriented.  Appropriate affect.   LABORATORY DATA:  None for this visit.  DIAGNOSTIC IMAGING:  None for this visit.      ASSESSMENT AND PLAN:  Ms.. Latoya Fernandez is a pleasant 53 y.o. female with Stage *** right/left breast invasive ductal carcinoma, ER+/PR+/HER2-, diagnosed in ***, treated with lumpectomy, adjuvant radiation therapy, and anti-estrogen therapy with *** beginning in ***.  She presents to the Survivorship Clinic for our initial meeting and routine follow-up post-completion of treatment for breast cancer.    1. Stage *** right/left breast cancer:  Ms. Latoya Fernandez is continuing to recover from definitive treatment for breast cancer. She will follow-up with her medical oncologist, Dr.  PIERRETTE with history and physical exam per surveillance protocol.  She will continue her anti-estrogen therapy with ***. Thus far, she is tolerating the *** well, with minimal side effects. Her mammogram is due ***; orders placed today.   Today, a comprehensive survivorship care plan and treatment summary was reviewed with the patient today detailing her breast cancer diagnosis, treatment course, potential late/long-term effects of treatment, appropriate follow-up care with recommendations for the future, and patient education resources.  A copy of this summary, along with a letter will be sent to the patient's primary care provider via mail/fax/In Basket message after today's visit.    #. Problem(s) at Visit______________  #. Bone health:  Given Ms. Ramseyer's age/history of breast cancer and her current treatment regimen including anti-estrogen therapy with ***, she is at risk for bone demineralization.  Her last DEXA scan was ***, which showed ***.  In the meantime, she was encouraged to  increase her consumption of foods rich in calcium, as well as increase her weight-bearing activities.  She was given education on specific activities to promote bone health.  #. Cancer screening:  Due to Ms. Aquilar's history and her age, she should receive screening for skin cancers, colon cancer, and gynecologic cancers.  The information and recommendations are listed on the patient's comprehensive care plan/treatment summary and were reviewed in detail with the patient.    #. Health maintenance and wellness promotion: Ms. Dusenbury was encouraged to consume 5-7 servings of fruits and vegetables per day. We reviewed the Nutrition Rainbow handout.  She was also encouraged to engage in moderate to vigorous exercise for 30 minutes per day most days of the  week.  She was instructed to limit her alcohol consumption and continue to abstain from tobacco use/***was encouraged stop smoking.     #. Support services/counseling: It is not uncommon for this period of the patient's cancer care trajectory to be one of many emotions and stressors.   She was given information regarding our available services and encouraged to contact me with any questions or for help enrolling in any of our support group/programs.    Follow up instructions:    -Return to cancer center ***  -Mammogram due in *** -She is welcome to return back to the Survivorship Clinic at any time; no additional follow-up needed at this time.  -Consider referral back to survivorship as a long-term survivor for continued surveillance  The patient was provided an opportunity to ask questions and all were answered. The patient agreed with the plan and demonstrated an understanding of the instructions.   Total encounter time:*** minutes*in face-to-face visit time, chart review, lab review, care coordination, order entry, and documentation of the encounter time.    Morna Kendall, NP 07/31/24 11:41 AM Medical Oncology and Hematology [facility] [address] [place], [place] [address] Tel. [phone]    Fax. [phone]  *Total Encounter Time as defined by the Centers for Medicare and Medicaid Services includes, in addition to the face-to-face time of a patient visit (documented in the note above) non-face-to-face time: obtaining and reviewing outside history, ordering and reviewing medications, tests or procedures, care coordination (communications with other health care professionals or caregivers) and documentation in the medical record.

## 2024-08-15 ENCOUNTER — Encounter: Payer: Self-pay | Admitting: *Deleted

## 2024-08-15 NOTE — Progress Notes (Signed)
 Pt's Survivorship Care Plan summary has been translated and mailed today to patient. RN Navigator completed task.

## 2024-08-18 ENCOUNTER — Encounter: Admitting: Adult Health

## 2024-09-11 ENCOUNTER — Ambulatory Visit: Payer: Self-pay

## 2024-09-29 ENCOUNTER — Ambulatory Visit: Payer: Self-pay

## 2024-11-25 ENCOUNTER — Other Ambulatory Visit: Payer: Self-pay

## 2024-11-25 DIAGNOSIS — Z853 Personal history of malignant neoplasm of breast: Secondary | ICD-10-CM

## 2024-11-25 NOTE — Addendum Note (Signed)
 Addended by: LOGAN BOTTCHER E on: 11/25/2024 10:27 AM   Modules accepted: Orders

## 2025-01-01 ENCOUNTER — Inpatient Hospital Stay: Payer: Self-pay | Admitting: Hematology and Oncology

## 2025-02-03 ENCOUNTER — Ambulatory Visit

## 2025-02-03 ENCOUNTER — Encounter
